# Patient Record
Sex: Female | Born: 1960 | Race: White | Hispanic: No | Smoking: Current every day smoker
Health system: Southern US, Community
[De-identification: ages and names within clinical notes are randomized; demographics above are authoritative.]

## PROBLEM LIST (undated history)

## (undated) DIAGNOSIS — F32A Depression, unspecified: Secondary | ICD-10-CM

## (undated) DIAGNOSIS — R569 Unspecified convulsions: Secondary | ICD-10-CM

## (undated) DIAGNOSIS — L409 Psoriasis, unspecified: Secondary | ICD-10-CM

## (undated) DIAGNOSIS — M0520 Rheumatoid vasculitis with rheumatoid arthritis of unspecified site: Secondary | ICD-10-CM

## (undated) DIAGNOSIS — F419 Anxiety disorder, unspecified: Secondary | ICD-10-CM

## (undated) DIAGNOSIS — J449 Chronic obstructive pulmonary disease, unspecified: Secondary | ICD-10-CM

## (undated) DIAGNOSIS — R0600 Dyspnea, unspecified: Secondary | ICD-10-CM

## (undated) HISTORY — PX: KNEE SURGERY: SHX244

## (undated) HISTORY — PX: LAPAROSCOPIC HYSTERECTOMY: SHX1926

---

## 1998-10-15 ENCOUNTER — Emergency Department (HOSPITAL_COMMUNITY): Admission: EM | Admit: 1998-10-15 | Discharge: 1998-10-15 | Payer: Self-pay | Admitting: Emergency Medicine

## 1998-10-15 ENCOUNTER — Encounter: Payer: Self-pay | Admitting: Emergency Medicine

## 2006-02-11 ENCOUNTER — Ambulatory Visit: Payer: Self-pay | Admitting: Cardiology

## 2021-06-10 DIAGNOSIS — L4059 Other psoriatic arthropathy: Secondary | ICD-10-CM | POA: Diagnosis not present

## 2021-06-10 DIAGNOSIS — L409 Psoriasis, unspecified: Secondary | ICD-10-CM | POA: Diagnosis not present

## 2021-06-10 DIAGNOSIS — R5382 Chronic fatigue, unspecified: Secondary | ICD-10-CM | POA: Diagnosis not present

## 2021-06-10 DIAGNOSIS — Z6827 Body mass index (BMI) 27.0-27.9, adult: Secondary | ICD-10-CM | POA: Diagnosis not present

## 2021-06-10 DIAGNOSIS — M79642 Pain in left hand: Secondary | ICD-10-CM | POA: Diagnosis not present

## 2021-06-10 DIAGNOSIS — M79641 Pain in right hand: Secondary | ICD-10-CM | POA: Diagnosis not present

## 2021-06-10 DIAGNOSIS — L03011 Cellulitis of right finger: Secondary | ICD-10-CM | POA: Diagnosis not present

## 2021-06-10 DIAGNOSIS — E663 Overweight: Secondary | ICD-10-CM | POA: Diagnosis not present

## 2021-07-08 DIAGNOSIS — R7989 Other specified abnormal findings of blood chemistry: Secondary | ICD-10-CM | POA: Diagnosis not present

## 2021-08-26 DIAGNOSIS — L409 Psoriasis, unspecified: Secondary | ICD-10-CM | POA: Diagnosis not present

## 2021-08-26 DIAGNOSIS — M79641 Pain in right hand: Secondary | ICD-10-CM | POA: Diagnosis not present

## 2021-08-26 DIAGNOSIS — M79642 Pain in left hand: Secondary | ICD-10-CM | POA: Diagnosis not present

## 2021-08-26 DIAGNOSIS — R5382 Chronic fatigue, unspecified: Secondary | ICD-10-CM | POA: Diagnosis not present

## 2021-08-26 DIAGNOSIS — L4059 Other psoriatic arthropathy: Secondary | ICD-10-CM | POA: Diagnosis not present

## 2021-08-26 DIAGNOSIS — L03011 Cellulitis of right finger: Secondary | ICD-10-CM | POA: Diagnosis not present

## 2021-08-26 DIAGNOSIS — Z6828 Body mass index (BMI) 28.0-28.9, adult: Secondary | ICD-10-CM | POA: Diagnosis not present

## 2021-08-26 DIAGNOSIS — E663 Overweight: Secondary | ICD-10-CM | POA: Diagnosis not present

## 2021-08-28 DIAGNOSIS — L4050 Arthropathic psoriasis, unspecified: Secondary | ICD-10-CM | POA: Diagnosis not present

## 2021-08-28 DIAGNOSIS — L403 Pustulosis palmaris et plantaris: Secondary | ICD-10-CM | POA: Diagnosis not present

## 2021-08-28 DIAGNOSIS — L601 Onycholysis: Secondary | ICD-10-CM | POA: Diagnosis not present

## 2021-09-01 DIAGNOSIS — E559 Vitamin D deficiency, unspecified: Secondary | ICD-10-CM | POA: Diagnosis not present

## 2021-09-01 DIAGNOSIS — R69 Illness, unspecified: Secondary | ICD-10-CM | POA: Diagnosis not present

## 2021-09-01 DIAGNOSIS — J449 Chronic obstructive pulmonary disease, unspecified: Secondary | ICD-10-CM | POA: Diagnosis not present

## 2021-09-01 DIAGNOSIS — Z1239 Encounter for other screening for malignant neoplasm of breast: Secondary | ICD-10-CM | POA: Diagnosis not present

## 2021-09-01 DIAGNOSIS — Z79899 Other long term (current) drug therapy: Secondary | ICD-10-CM | POA: Diagnosis not present

## 2021-09-01 DIAGNOSIS — L4050 Arthropathic psoriasis, unspecified: Secondary | ICD-10-CM | POA: Diagnosis not present

## 2021-09-01 DIAGNOSIS — F17200 Nicotine dependence, unspecified, uncomplicated: Secondary | ICD-10-CM | POA: Diagnosis not present

## 2021-09-13 DIAGNOSIS — R220 Localized swelling, mass and lump, head: Secondary | ICD-10-CM | POA: Diagnosis not present

## 2021-09-13 DIAGNOSIS — K029 Dental caries, unspecified: Secondary | ICD-10-CM | POA: Diagnosis not present

## 2021-09-13 DIAGNOSIS — R6884 Jaw pain: Secondary | ICD-10-CM | POA: Diagnosis not present

## 2021-09-13 DIAGNOSIS — K047 Periapical abscess without sinus: Secondary | ICD-10-CM | POA: Diagnosis not present

## 2021-09-18 ENCOUNTER — Inpatient Hospital Stay (HOSPITAL_COMMUNITY)
Admission: EM | Admit: 2021-09-18 | Discharge: 2021-09-23 | DRG: 137 | Disposition: A | Discharge status: Home Health | Payer: Medicare HMO | Attending: Internal Medicine | Admitting: Internal Medicine

## 2021-09-18 ENCOUNTER — Other Ambulatory Visit: Payer: Self-pay

## 2021-09-18 ENCOUNTER — Emergency Department (HOSPITAL_COMMUNITY): Payer: Medicare HMO

## 2021-09-18 ENCOUNTER — Encounter (HOSPITAL_COMMUNITY): Payer: Self-pay | Admitting: Emergency Medicine

## 2021-09-18 DIAGNOSIS — J391 Other abscess of pharynx: Secondary | ICD-10-CM | POA: Diagnosis not present

## 2021-09-18 DIAGNOSIS — F17210 Nicotine dependence, cigarettes, uncomplicated: Secondary | ICD-10-CM | POA: Diagnosis present

## 2021-09-18 DIAGNOSIS — D62 Acute posthemorrhagic anemia: Secondary | ICD-10-CM | POA: Diagnosis not present

## 2021-09-18 DIAGNOSIS — J386 Stenosis of larynx: Secondary | ICD-10-CM | POA: Diagnosis present

## 2021-09-18 DIAGNOSIS — Z20822 Contact with and (suspected) exposure to covid-19: Secondary | ICD-10-CM | POA: Diagnosis present

## 2021-09-18 DIAGNOSIS — Z79899 Other long term (current) drug therapy: Secondary | ICD-10-CM | POA: Diagnosis not present

## 2021-09-18 DIAGNOSIS — E876 Hypokalemia: Secondary | ICD-10-CM | POA: Diagnosis present

## 2021-09-18 DIAGNOSIS — K047 Periapical abscess without sinus: Principal | ICD-10-CM | POA: Diagnosis present

## 2021-09-18 DIAGNOSIS — L0201 Cutaneous abscess of face: Secondary | ICD-10-CM | POA: Diagnosis not present

## 2021-09-18 DIAGNOSIS — E871 Hypo-osmolality and hyponatremia: Secondary | ICD-10-CM | POA: Diagnosis present

## 2021-09-18 DIAGNOSIS — J9601 Acute respiratory failure with hypoxia: Secondary | ICD-10-CM | POA: Diagnosis present

## 2021-09-18 DIAGNOSIS — R739 Hyperglycemia, unspecified: Secondary | ICD-10-CM | POA: Diagnosis not present

## 2021-09-18 DIAGNOSIS — M069 Rheumatoid arthritis, unspecified: Secondary | ICD-10-CM | POA: Diagnosis present

## 2021-09-18 DIAGNOSIS — Z4682 Encounter for fitting and adjustment of non-vascular catheter: Secondary | ICD-10-CM | POA: Diagnosis not present

## 2021-09-18 DIAGNOSIS — F17290 Nicotine dependence, other tobacco product, uncomplicated: Secondary | ICD-10-CM | POA: Diagnosis present

## 2021-09-18 DIAGNOSIS — Z9911 Dependence on respirator [ventilator] status: Secondary | ICD-10-CM | POA: Diagnosis not present

## 2021-09-18 DIAGNOSIS — F419 Anxiety disorder, unspecified: Secondary | ICD-10-CM | POA: Diagnosis present

## 2021-09-18 DIAGNOSIS — J439 Emphysema, unspecified: Secondary | ICD-10-CM | POA: Diagnosis present

## 2021-09-18 DIAGNOSIS — Z90710 Acquired absence of both cervix and uterus: Secondary | ICD-10-CM | POA: Diagnosis not present

## 2021-09-18 DIAGNOSIS — R252 Cramp and spasm: Secondary | ICD-10-CM | POA: Diagnosis not present

## 2021-09-18 DIAGNOSIS — J95821 Acute postprocedural respiratory failure: Secondary | ICD-10-CM | POA: Diagnosis not present

## 2021-09-18 DIAGNOSIS — M272 Inflammatory conditions of jaws: Secondary | ICD-10-CM | POA: Diagnosis present

## 2021-09-18 DIAGNOSIS — F32A Depression, unspecified: Secondary | ICD-10-CM | POA: Diagnosis present

## 2021-09-18 DIAGNOSIS — L4050 Arthropathic psoriasis, unspecified: Secondary | ICD-10-CM | POA: Diagnosis not present

## 2021-09-18 DIAGNOSIS — M26602 Left temporomandibular joint disorder, unspecified: Secondary | ICD-10-CM | POA: Diagnosis not present

## 2021-09-18 DIAGNOSIS — J9811 Atelectasis: Secondary | ICD-10-CM | POA: Diagnosis not present

## 2021-09-18 DIAGNOSIS — J449 Chronic obstructive pulmonary disease, unspecified: Secondary | ICD-10-CM | POA: Diagnosis not present

## 2021-09-18 DIAGNOSIS — J387 Other diseases of larynx: Secondary | ICD-10-CM | POA: Diagnosis not present

## 2021-09-18 DIAGNOSIS — J988 Other specified respiratory disorders: Secondary | ICD-10-CM | POA: Diagnosis not present

## 2021-09-18 DIAGNOSIS — Z716 Tobacco abuse counseling: Secondary | ICD-10-CM

## 2021-09-18 DIAGNOSIS — F418 Other specified anxiety disorders: Secondary | ICD-10-CM | POA: Diagnosis not present

## 2021-09-18 DIAGNOSIS — R69 Illness, unspecified: Secondary | ICD-10-CM | POA: Diagnosis not present

## 2021-09-18 DIAGNOSIS — L0291 Cutaneous abscess, unspecified: Secondary | ICD-10-CM | POA: Diagnosis present

## 2021-09-18 DIAGNOSIS — A419 Sepsis, unspecified organism: Secondary | ICD-10-CM | POA: Diagnosis not present

## 2021-09-18 DIAGNOSIS — Z872 Personal history of diseases of the skin and subcutaneous tissue: Secondary | ICD-10-CM | POA: Diagnosis not present

## 2021-09-18 HISTORY — DX: Anxiety disorder, unspecified: F41.9

## 2021-09-18 HISTORY — DX: Psoriasis, unspecified: L40.9

## 2021-09-18 HISTORY — DX: Dyspnea, unspecified: R06.00

## 2021-09-18 HISTORY — DX: Rheumatoid vasculitis with rheumatoid arthritis of unspecified site: M05.20

## 2021-09-18 HISTORY — DX: Chronic obstructive pulmonary disease, unspecified: J44.9

## 2021-09-18 HISTORY — DX: Unspecified convulsions: R56.9

## 2021-09-18 HISTORY — DX: Depression, unspecified: F32.A

## 2021-09-18 LAB — CBC WITH DIFFERENTIAL/PLATELET
Abs Immature Granulocytes: 0.04 10*3/uL (ref 0.00–0.07)
Basophils Absolute: 0 10*3/uL (ref 0.0–0.1)
Basophils Relative: 0 %
Eosinophils Absolute: 0.1 10*3/uL (ref 0.0–0.5)
Eosinophils Relative: 1 %
HCT: 33.6 % — ABNORMAL LOW (ref 36.0–46.0)
Hemoglobin: 11.4 g/dL — ABNORMAL LOW (ref 12.0–15.0)
Immature Granulocytes: 1 %
Lymphocytes Relative: 20 %
Lymphs Abs: 1.4 10*3/uL (ref 0.7–4.0)
MCH: 30.2 pg (ref 26.0–34.0)
MCHC: 33.9 g/dL (ref 30.0–36.0)
MCV: 89.1 fL (ref 80.0–100.0)
Monocytes Absolute: 0.6 10*3/uL (ref 0.1–1.0)
Monocytes Relative: 8 %
Neutro Abs: 5 10*3/uL (ref 1.7–7.7)
Neutrophils Relative %: 70 %
Platelets: 229 10*3/uL (ref 150–400)
RBC: 3.77 MIL/uL — ABNORMAL LOW (ref 3.87–5.11)
RDW: 12.9 % (ref 11.5–15.5)
WBC: 7.1 10*3/uL (ref 4.0–10.5)
nRBC: 0 % (ref 0.0–0.2)

## 2021-09-18 LAB — COMPREHENSIVE METABOLIC PANEL
ALT: 15 U/L (ref 0–44)
AST: 12 U/L — ABNORMAL LOW (ref 15–41)
Albumin: 2.7 g/dL — ABNORMAL LOW (ref 3.5–5.0)
Alkaline Phosphatase: 97 U/L (ref 38–126)
Anion gap: 11 (ref 5–15)
BUN: 18 mg/dL (ref 6–20)
CO2: 30 mmol/L (ref 22–32)
Calcium: 8.3 mg/dL — ABNORMAL LOW (ref 8.9–10.3)
Chloride: 91 mmol/L — ABNORMAL LOW (ref 98–111)
Creatinine, Ser: 0.74 mg/dL (ref 0.44–1.00)
GFR, Estimated: >60 mL/min (ref >60)
Glucose, Bld: 134 mg/dL — ABNORMAL HIGH (ref 70–99)
Potassium: 2.3 mmol/L — CL (ref 3.5–5.1)
Sodium: 132 mmol/L — ABNORMAL LOW (ref 135–145)
Total Bilirubin: 0.7 mg/dL (ref 0.3–1.2)
Total Protein: 6.5 g/dL (ref 6.5–8.1)

## 2021-09-18 LAB — MAGNESIUM
Magnesium: 1.5 mg/dL — ABNORMAL LOW (ref 1.7–2.4)
Magnesium: 1.4 mg/dL — ABNORMAL LOW (ref 1.7–2.4)

## 2021-09-18 LAB — RESP PANEL BY RT-PCR (FLU A&B, COVID) ARPGX2
Influenza A by PCR: NEGATIVE
Influenza B by PCR: NEGATIVE
SARS Coronavirus 2 by RT PCR: NEGATIVE

## 2021-09-18 LAB — HIV ANTIBODY (ROUTINE TESTING W REFLEX): HIV Screen 4th Generation wRfx: NONREACTIVE

## 2021-09-18 MED ORDER — MORPHINE SULFATE (PF) 4 MG/ML IV SOLN
4.0000 mg | Freq: Once | INTRAVENOUS | Status: AC
  Administered 2021-09-18: 4 mg via INTRAVENOUS
  Filled 2021-09-18: qty 1

## 2021-09-18 MED ORDER — CHLORHEXIDINE GLUCONATE CLOTH 2 % EX PADS: 6.0000 | MEDICATED_PAD | Freq: Once | CUTANEOUS | Status: DC

## 2021-09-18 MED ORDER — ALBUTEROL SULFATE (2.5 MG/3ML) 0.083% IN NEBU: 2.5000 mg | INHALATION_SOLUTION | RESPIRATORY_TRACT | Status: DC | PRN

## 2021-09-18 MED ORDER — ALBUTEROL SULFATE HFA 108 (90 BASE) MCG/ACT IN AERS: 1.0000 | INHALATION_SPRAY | RESPIRATORY_TRACT | Status: DC | PRN

## 2021-09-18 MED ORDER — ONDANSETRON HCL 4 MG/2ML IJ SOLN: 4.0000 mg | Freq: Four times a day (QID) | INTRAMUSCULAR | Status: DC | PRN

## 2021-09-18 MED ORDER — MAGNESIUM SULFATE 2 GM/50ML IV SOLN
2.0000 g | Freq: Once | INTRAVENOUS | Status: AC
  Administered 2021-09-18: 2 g via INTRAVENOUS
  Filled 2021-09-18: qty 50

## 2021-09-18 MED ORDER — SODIUM CHLORIDE 0.9 % IV SOLN
2.0000 g | INTRAVENOUS | Status: AC
  Administered 2021-09-19: 2 g via INTRAVENOUS
  Filled 2021-09-18: qty 20

## 2021-09-18 MED ORDER — POTASSIUM CHLORIDE 10 MEQ/100ML IV SOLN
10.0000 meq | Freq: Once | INTRAVENOUS | Status: AC
  Administered 2021-09-18: 10 meq via INTRAVENOUS
  Filled 2021-09-18: qty 100

## 2021-09-18 MED ORDER — ENOXAPARIN SODIUM 40 MG/0.4ML IJ SOSY
40.0000 mg | PREFILLED_SYRINGE | INTRAMUSCULAR | Status: DC
  Administered 2021-09-18 – 2021-09-22 (×5): 40 mg via SUBCUTANEOUS
  Filled 2021-09-18 (×5): qty 0.4

## 2021-09-18 MED ORDER — HYDROMORPHONE HCL 1 MG/ML IJ SOLN
1.0000 mg | INTRAMUSCULAR | Status: DC | PRN
  Administered 2021-09-18 – 2021-09-19 (×4): 1 mg via INTRAVENOUS
  Filled 2021-09-18 (×4): qty 1

## 2021-09-18 MED ORDER — METRONIDAZOLE 500 MG/100ML IV SOLN
500.0000 mg | INTRAVENOUS | Status: AC
  Administered 2021-09-19: 500 mg via INTRAVENOUS

## 2021-09-18 MED ORDER — SODIUM CHLORIDE 0.9 % IV SOLN
2.0000 g | INTRAVENOUS | Status: DC
  Administered 2021-09-18 – 2021-09-23 (×6): 2 g via INTRAVENOUS
  Filled 2021-09-18 (×6): qty 20

## 2021-09-18 MED ORDER — NICOTINE 21 MG/24HR TD PT24
21.0000 mg | MEDICATED_PATCH | Freq: Every day | TRANSDERMAL | Status: DC
  Administered 2021-09-18 – 2021-09-23 (×5): 21 mg via TRANSDERMAL
  Filled 2021-09-18 (×5): qty 1

## 2021-09-18 MED ORDER — ONDANSETRON HCL 4 MG/2ML IJ SOLN
4.0000 mg | Freq: Once | INTRAMUSCULAR | Status: AC
  Administered 2021-09-18: 4 mg via INTRAVENOUS
  Filled 2021-09-18: qty 2

## 2021-09-18 MED ORDER — ONDANSETRON HCL 4 MG PO TABS: 4.0000 mg | ORAL_TABLET | Freq: Four times a day (QID) | ORAL | Status: DC | PRN

## 2021-09-18 MED ORDER — METRONIDAZOLE 500 MG/100ML IV SOLN
500.0000 mg | Freq: Two times a day (BID) | INTRAVENOUS | Status: DC
  Administered 2021-09-18 – 2021-09-23 (×9): 500 mg via INTRAVENOUS
  Filled 2021-09-18 (×10): qty 100

## 2021-09-18 MED ORDER — IOHEXOL 300 MG/ML  SOLN
75.0000 mL | Freq: Once | INTRAMUSCULAR | Status: AC | PRN
  Administered 2021-09-18: 75 mL via INTRAVENOUS

## 2021-09-18 MED ORDER — KCL IN DEXTROSE-NACL 20-5-0.45 MEQ/L-%-% IV SOLN
INTRAVENOUS | Status: DC
  Filled 2021-09-18 (×5): qty 1000

## 2021-09-18 MED ORDER — FENTANYL CITRATE PF 50 MCG/ML IJ SOSY
50.0000 ug | PREFILLED_SYRINGE | Freq: Once | INTRAMUSCULAR | Status: AC
  Administered 2021-09-18: 50 ug via INTRAVENOUS
  Filled 2021-09-18: qty 1

## 2021-09-18 NOTE — ED Notes (Signed)
Up to bathroom

## 2021-09-18 NOTE — Anesthesia Preprocedure Evaluation (Addendum)
Anesthesia Evaluation  ?Patient identified by MRN, date of birth, ID band ?Patient awake ? ? ? ?Reviewed: ?Allergy & Precautions, NPO status , Patient's Chart, lab work & pertinent test results ? ?History of Anesthesia Complications ?Negative for: history of anesthetic complications ? ?Airway ?Mallampati: Unable to assess ? ?TM Distance: >3 FB ?Neck ROM: Limited ? ?Mouth opening: Limited Mouth Opening ?Comment: Severe infection of submandibular space, tooth, oropharynx on L, extends into orbit  Dental ? ?(+) Dental Advisory Given ?  ?Pulmonary ?COPD,  COPD inhaler, Current Smoker and Patient abstained from smoking.,  ?  ?breath sounds clear to auscultation ? ? ? ? ? ? Cardiovascular ?negative cardio ROS ? ? ?Rhythm:Regular Rate:Normal ? ? ?  ?Neuro/Psych ?Seizures -,  Anxiety Depression   ? GI/Hepatic ?negative GI ROS, Neg liver ROS,   ?Endo/Other  ? ? Renal/GU ?negative Renal ROS  ? ?  ?Musculoskeletal ? ?(+) Arthritis , Rheumatoid disorders,   ? Abdominal ?  ?Peds ? Hematology ?negative hematology ROS ?(+)   ?Anesthesia Other Findings ?CT: Mild soft tissue thickening involving the left aspect of the epiglottis, tongue base, and lateral oropharynx ?Widespread, likely odontogenic infection involving the left ?submandibular and left masticator spaces with a large abscess and ?myositis extending from the masticator space into the scalp with ?superior extent not included on this study ? Reproductive/Obstetrics ? ?  ? ? ? ? ? ? ? ? ? ? ? ? ? ?  ?  ? ? ? ? ? ? ? ?Anesthesia Physical ?Anesthesia Plan ? ?ASA: 4 ? ?Anesthesia Plan: General  ? ?Post-op Pain Management: Ofirmev IV (intra-op)*  ? ?Induction: Intravenous ? ?PONV Risk Score and Plan: 3 and Ondansetron, Dexamethasone and Treatment may vary due to age or medical condition ? ?Airway Management Planned: Awake Intubation Planned and Fiberoptic Intubation Planned ? ?Additional Equipment: None ? ?Intra-op Plan:  ? ?Post-operative Plan:  Post-operative intubation/ventilation ? ?Informed Consent: I have reviewed the patients History and Physical, chart, labs and discussed the procedure including the risks, benefits and alternatives for the proposed anesthesia with the patient or authorized representative who has indicated his/her understanding and acceptance.  ? ? ? ?Dental advisory given ? ?Plan Discussed with: CRNA and Surgeon ? ?Anesthesia Plan Comments: (Awake intubation)  ? ? ? ? ? ?Anesthesia Quick Evaluation ? ?

## 2021-09-18 NOTE — Assessment & Plan Note (Signed)
Stable from a respiratory standpoint at present ?Continue home regimen ?Follow ?

## 2021-09-18 NOTE — Assessment & Plan Note (Signed)
K 2.3 on presentation  ?Suspect secondary to decreased po intake  ?Replete  ?Also replete Mg ?Follow  ? ?

## 2021-09-18 NOTE — Assessment & Plan Note (Signed)
Baseline 1 pack/day smoker ?Discussed smoking cessation at length ?Suspect this is likely confounding issue in the setting of dental abscess ?Patient expressed understanding ?We will place nicotine patch ?Follow ?

## 2021-09-18 NOTE — Assessment & Plan Note (Signed)
Noted progressively worsening left-sided dental abscess status post course of clindamycin and Augmentin over the past 7 days. ?CT soft tissue showing Widespread odontogenic infection involving the left submandibular and left masticator spaces with a large abscess and myositis extending from the masticator space into the scalp ?Dr. Herbie Saxon with oral surgery/ENT coverage aware of case ?Planning for surgical I&D in the morning ?Started on IV Rocephin and Flagyl per recommendations in the ER ?We will continue IV antibiotics ?Pain control ?Follow closely ? ?

## 2021-09-18 NOTE — Assessment & Plan Note (Signed)
Appears to be at baseline  ?Follow  ? ?

## 2021-09-18 NOTE — H&P (Signed)
?History and Physical  ? ? ?Patient: Carolyn Black ZOX:[id number] DOB: 1960-09-08 ?DOA: 09/18/2021 ?DOS: the patient was seen and examined on 09/18/2021 ?PCP: [facility]  ?Patient coming from: Home ? ?Chief Complaint:  ?Chief Complaint  ?Patient presents with  ? Dental Pain  ? ?HPI: Carolyn Black is a 61 y.o. female with medical history significant of COPD, tobacco abuse, psoriatic arthritis presenting with left-sided dental abscess.  Patient with noted dental pain for the past week.  Patient was initially seen in the ER for symptoms and was placed on clindamycin for antibiotic coverage.  Per report, patient had worsening pain and was reevaluated by the dentist later in the week.  Patient was then switched to Augmentin.  Per the patient, symptoms have progressively worsened despite antibiotic coverage.  Positive generalized malaise as well as decreased p.o. intake.  Minimal chest pain or shortness of breath.  This is baseline in setting of COPD per patient no belly pain.  No hemiparesis or confusion.  Positive nausea.  No reported vomiting. ?Presented to the ER afebrile, hemodynamically stable.  White count 7.1, hemoglobin 11.4.  Potassium 2.3, magnesium 1.5.  Creatinine 0.74.  CT of the soft tissue neck showing widespread odontogenic infection involving the left-sided submandibular and left masticator spaces with large abscess and myositis extend into the masticator space into the scalp. ? ?Review of Systems: As mentioned in the history of present illness. All other systems reviewed and are negative. ?Past Medical History:  ?Diagnosis Date  ? COPD (chronic obstructive pulmonary disease) (HCC)   ? Psoriasis   ? Rheumatoid arteritis (HCC)   ? Seizures (HCC)   ? ? ?Social History:  has no history on file for tobacco use, alcohol use, and drug use. ? ?No Known Allergies ? ?No family history on file. ? ?Prior to Admission medications   ?Medication Sig Start Date End Date Taking? Authorizing Provider  ?ADVAIR  DISKUS 250-50 MCG/ACT AEPB Inhale 1 puff into the lungs 2 (two) times daily. 09/01/21  Yes [provider]  ?albuterol (VENTOLIN HFA) 108 (90 Base) MCG/ACT inhaler Inhale into the lungs. 09/02/21  Yes [provider]  ?ALPRAZolam Prudy Feeler) 0.25 MG tablet Take 0.25 mg by mouth 2 (two) times daily as needed for anxiety.   Yes [provider]  ?amoxicillin-clavulanate (AUGMENTIN) 875-125 MG tablet Take 1 tablet by mouth 2 (two) times daily. 09/16/21  Yes [provider]  ?cetirizine (ZYRTEC) 10 MG tablet Take 10 mg by mouth daily. 08/28/21  Yes [provider]  ?Cholecalciferol (VITAMIN D3 PO) Take 50,000 Units by mouth See admin instructions. 1 tablet weekly on Saturdays   Yes [provider]  ?clindamycin (CLEOCIN) 300 MG capsule Take 300 mg by mouth 3 (three) times daily. 09/14/21  Yes [provider]  ?diclofenac (VOLTAREN) 75 MG EC tablet Take 75 mg by mouth 2 (two) times daily.   Yes [provider]  ?diphenhydrAMINE (BENADRYL) 25 MG tablet Take 25 mg by mouth at bedtime as needed for sleep. 08/28/21 09/27/21 Yes [provider]  ?lidocaine (XYLOCAINE) 5 % ointment Apply topically. 08/11/21  Yes [provider]  ?oxyCODONE (OXY IR/ROXICODONE) 5 MG immediate release tablet Take 5 mg by mouth See admin instructions. 2 capsules by mouth every 4 to 6 hours as needed for pain   Yes [provider]  ?traMADol (ULTRAM) 50 MG tablet Take by mouth.   Yes [provider]  ? ? ?Physical Exam: ?Vitals:  ? 09/18/21 1636 09/18/21 1645 09/18/21 1700 09/18/21  1730  ?BP:  (!) 162/66 132/60 140/68  ?Pulse:  80 67 88  ?Resp:  18 17 (!) 21  ?Temp: 98.9 ?F (37.2 ?C)     ?TempSrc: Oral     ?SpO2:  92% 90% 94%  ?Weight:      ?Height:      ? ?Physical Exam ?Constitutional:   ?   Comments: In mild distress 2/2 pain  ?  ?HENT:  ?   Head:  ? ?   Comments: + marked redness and swelling of affected area  ?+ difficulty opening oral cavity secondary  to severe pain  ?Airway appears intact  ? ?Eyes:  ?   Pupils: Pupils are equal, round, and reactive to light.  ?Neck:  ?   Comments: As noted  ?Cardiovascular:  ?   Rate and Rhythm: Normal rate and regular rhythm.  ?Pulmonary:  ?   Effort: Pulmonary effort is normal.  ?   Breath sounds: No wheezing.  ?Abdominal:  ?   General: Bowel sounds are normal.  ?Musculoskeletal:     ?   General: Normal range of motion.  ?Skin: ?   General: Skin is warm.  ?Neurological:  ?   General: No focal deficit present.  ?Psychiatric:     ?   Mood and Affect: Mood normal.  ?  ?Data Reviewed: ? ?  Latest Ref Rng & Units 09/18/2021  ?  3:05 PM  ?CBC  ?WBC 4.0 - 10.5 K/uL 7.1    ?Hemoglobin 12.0 - 15.0 g/dL 16.111.4    ?Hematocrit 36.0 - 46.0 % 33.6    ?Platelets 150 - 400 K/uL 229    ?  ?Lab Results  ?Component Value Date  ? NA 132 (L) 09/18/2021  ? K 2.3 (LL) 09/18/2021  ? CO2 30 09/18/2021  ? GLUCOSE 134 (H) 09/18/2021  ? BUN 18 09/18/2021  ? CREATININE 0.74 09/18/2021  ? CALCIUM 8.3 (L) 09/18/2021  ? GFRNONAA >60 09/18/2021  ?  ?CT Soft Tissue Neck W Contrast ?Addendum: ADDENDUM REPORT: 09/18/2021 17:26 ? ? ADDENDUM: ? Mild soft tissue thickening involving the left aspect of the ? epiglottis, tongue base, and lateral oropharynx. This may be ? inflammatory, however direct visualization is recommended to exclude ? a mass. ? ? Electronically Signed ?   By: Sebastian AcheAllen  Grady M.D. ?   On: 09/18/2021 17:26 ?Narrative: CLINICAL DATA:  Left-sided facial soft tissue swelling. Infection ?suspected. ? ?EXAM: ?CT NECK WITH CONTRAST ? ?TECHNIQUE: ?Multidetector CT imaging of the neck was performed using the ?standard protocol following the bolus administration of intravenous ?contrast. ? ?RADIATION DOSE REDUCTION: This exam was performed according to the ?departmental dose-optimization program which includes automated ?exposure control, adjustment of the mA and/or kV according to ?patient size and/or use of iterative reconstruction technique. ? ?CONTRAST:   75mL OMNIPAQUE IOHEXOL 300 MG/ML  SOLN ? ?COMPARISON:  None Available. ? ?FINDINGS: ?Pharynx and larynx: Asymmetric, mild thickening involving the left ?aspect of the epiglottis, left tongue base, and left lateral ?oropharyngeal wall. No retropharyngeal fluid collection. ? ?Salivary glands: Marked asymmetric atrophy of the right ?submandibular gland. Mass effect on and possibly secondary ?inflammation of the left parotid gland by the process described ?below. ? ?Thyroid: Unremarkable. ? ?Lymph nodes: Mild asymmetric enlargement of left level I and II ?lymph nodes measuring up to 1 cm in short axis, likely reactive. ? ?Vascular: Major vascular structures of the neck are grossly patent. ?Mild atherosclerotic plaque at the carotid bifurcations. ? ?Limited intracranial: Unremarkable. ? ?Visualized orbits: Unremarkable. ? ?  Mastoids and visualized paranasal sinuses: Clear. ? ?Skeleton: Largely absent dentition. Caries and periapical lucencies ?involving the remaining mandibular incisors. 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 Mild cervical spondylosis. ? ?Upper chest: Emphysema. ? ?Other: There is inflammation within the soft tissues surrounding the ?left mandible beginning in the parasymphyseal region and extending ?posteriorly along the body and ramus. A small fluid collection along ?the deep margin of the anterior left mandibular body measures up to ?4 mm in short axis thickness. There is a larger, peripherally ?enhancing fluid collection which extends from the deep margin of the ?angle of the mandible on the left superiorly along the ramus with ?involvement of the masseter and pterygoid musculature and extension ?superiorly into the scalp on the left along the temporalis muscle ?with the superior extent of the collection not included on this ?study. This collection measures greater than 9 cm in craniocaudal ?dimension and measures approximately 4.5 cm in AP dimension at the ?level of the infratemporal fossa and  at least 6.5 cm in AP dimension ?in the left frontotemporal scalp. ? ?IMPRESSION: ?1. Widespread, likely odontogenic infection involving the left ?submandibular and left masticator spaces with a large

## 2021-09-18 NOTE — H&P (Signed)
H&P Infection ? ?Exam Date: 09/19/21 ? ?ID: The patient is a 25 yoF who presented with pain and swelling of the left face.. ? ?History of Present Illness:  The patient reports having pain and swelling that began over a week ago. She has been seen by several dental providers and has failed courses of clindamycin and augmentin and her swelling has continued to progress.  The patient was referred by OSH Forestine Na) for evaluation.  The patient reports trismus and fevers. Denies dyspnea or dysphagia.  ?  ?Clinical Exam: ?Extraoral Exam:  ?            Patient is alert, orientated and in distress ?            CN II-XII grossly intact ?            There is appreciable facial swelling on the left side of the face.  ?            The swelling extends into the neck/periorbital and temporal region  ?  ?Intraoral Exam:  ?            The patient does have trismus with maximum incisal opening of 20 mm. ?            The buccal vestibule is raised. ?            The floor of mouth is elevated on the left ?            The tongue is not elevated. ?            There is lateral pharyngeal swelling with the uvula deviated slightly to the right ?            Oral airway is patent  ?            Non-restorable remaining dentition #24-26 ?  ?            Cardiovascular:  Regular rate and rhythm without any appreciable murmurs, gallops or rubs.   ?            Respiratory: Lungs were clear to auscultation bilaterally without any wheezing or rhonchi. ?            Abdomen: Non-distended  ?  ?Radiographic Exam:   ?             ?            A CT of the larynx with contrast was obtained showing fluid collections/cellulitis associated with the left submandibular, sublingual, masticator (pterygomandibular, masseteric, and temporal) space. There is minimal airway deviation.  ?  ?Assessment: 56 yoF patient with a left submandibular, sublingual, and masticator space infection.  ?  ?Plan:  The patient will require incision and drainage of the infection with  extraction of teeth as necessary in the OR.  Consent will be obtained. ?  ?Risks, complications and alternatives of tooth extraction and incision and drainage procedures were discussed and questions were answered.  Among all potential risks and complications, I emphasized the potential for pain, bleeding, swelling, infection, localized alveolar osteitis (dry socket), temporary and permanent lingual and inferior alveolar nerve injury, oroantral (sinus) communication, oronasal communication, jaw fracture, damage to adjacent teeth and tissue, joint discomfort, bone/tooth fragments, recurrence of infection, need for additional procedures, facial nerve injury, scarring, limited mouth opening, drain placement, aspiration and anesthetic mishap. ?  ?Terese Door, DDS ?Oral and Maxillofacial Surgeon ?[facility] ([place]) ?Office # [phone] ?Cell #  [phone] ? ?

## 2021-09-18 NOTE — ED Notes (Signed)
Pt verbalized she is taking 1 of her prescribed pain pill that is due now.  ?

## 2021-09-18 NOTE — ED Notes (Signed)
Pt states that she has been taking her antibiotics as prescribed and pain meds. She is due for pain meds now and is requesting meds. States that all s/s have gotten worse ?

## 2021-09-18 NOTE — ED Provider Notes (Signed)
?[facility] [facility]PART[facility] ?Provider Note ? ? ?CSN: [id number] ?Arrival date & time: 09/18/21  1128 ? ?  ? ?History ? ?Chief Complaint  ?Patient presents with  ? Dental Pain  ? ? ?Carolyn Black is a 61 y.o. female with a history including rheumatoid arthritis, COPD, psoriasis presenting for evaluation of a 1 week history of dental pain, infection and swelling.  She was initially seen at an out side hospital 1 week ago at which time she was placed on clindamycin.  She continued to have in creasing pain and facial swelling, saw her dentist on Tuesday who switched her to Augmentin.  However she continues to have increasing pain and swelling, describing inability to eat or open her mouth although has been able to tolerate fluid intake.  She is able to swallow.  She has had no documented fevers but she does have intermittent chills and hot flashes.  Her pain and swelling has now tracked up to and including her left temple region.  She reports a bad taste in her mouth, endorses poor dentition.  She feels there may be an abscess that is draining. ?The history is provided by the patient.  ? ?  ? ?Home Medications ?Prior to Admission medications   ?Medication Sig Start Date End Date Taking? Authorizing Provider  ?ADVAIR DISKUS 250-50 MCG/ACT AEPB Inhale 1 puff into the lungs 2 (two) times daily. 09/01/21  Yes [provider]  ?albuterol (VENTOLIN HFA) 108 (90 Base) MCG/ACT inhaler Inhale into the lungs. 09/02/21  Yes [provider]  ?ALPRAZolam Prudy Feeler) 0.25 MG tablet Take 0.25 mg by mouth 2 (two) times daily as needed for anxiety.   Yes [provider]  ?amoxicillin-clavulanate (AUGMENTIN) 875-125 MG tablet Take 1 tablet by mouth 2 (two) times daily. 09/16/21  Yes [provider]  ?cetirizine (ZYRTEC) 10 MG tablet Take 10 mg by mouth daily. 08/28/21  Yes [provider]  ?Cholecalciferol (VITAMIN D3 PO) Take 50,000 Units by mouth See admin instructions. 1 tablet weekly on  Saturdays   Yes [provider]  ?clindamycin (CLEOCIN) 300 MG capsule Take 300 mg by mouth 3 (three) times daily. 09/14/21  Yes [provider]  ?diclofenac (VOLTAREN) 75 MG EC tablet Take 75 mg by mouth 2 (two) times daily.   Yes [provider]  ?diphenhydrAMINE (BENADRYL) 25 MG tablet Take 25 mg by mouth at bedtime as needed for sleep. 08/28/21 09/27/21 Yes [provider]  ?lidocaine (XYLOCAINE) 5 % ointment Apply topically. 08/11/21  Yes [provider]  ?oxyCODONE (OXY IR/ROXICODONE) 5 MG immediate release tablet Take 5 mg by mouth See admin instructions. 2 capsules by mouth every 4 to 6 hours as needed for pain   Yes [provider]  ?traMADol (ULTRAM) 50 MG tablet Take by mouth.   Yes [provider]  ?   ? ?Allergies    ?Patient has no known allergies.   ? ?Review of Systems   ?Review of Systems  ?Constitutional:  Positive for chills. Negative for fever.  ?HENT:  Positive for dental problem and facial swelling. Negative for sore throat and voice change.   ?Respiratory:  Negative for shortness of breath.   ?Musculoskeletal:  Negative for neck pain and neck stiffness.  ? ?Physical Exam ?Updated Vital Signs ?BP 140/68   Pulse 88   Temp 98.9 ?F (37.2 ?C) (Oral)   Resp (!) 21   Ht  (1.6 m)   Wt 71.7 kg   SpO2 94%   BMI  27.99 kg/m?  ?Physical Exam ?Vitals and nursing note reviewed.  ?Constitutional:   ?   Appearance: She is well-developed.  ?HENT:  ?   Head: Atraumatic.  ?   Jaw: Trismus, tenderness and pain on movement present.  ?   Comments: Patient has significant tender edema along the entirety of her left face from her submandibular region through her temple region.  There is no erythema present. ?   Right Ear: Tympanic membrane normal.  ?   Left Ear: Tympanic membrane normal.  ?   Mouth/Throat:  ?   Comments: Patient is mostly edentulous.  She is tender to palpation in her sublingual space, there is no appreciable induration.  Patient is  unable to tolerate movement of her jaw. ?Eyes:  ?   Conjunctiva/sclera: Conjunctivae normal.  ?Cardiovascular:  ?   Rate and Rhythm: Normal rate and regular rhythm.  ?   Heart sounds: Normal heart sounds.  ?Pulmonary:  ?   Effort: Pulmonary effort is normal.  ?   Breath sounds: Normal breath sounds. No wheezing.  ?Abdominal:  ?   General: Bowel sounds are normal.  ?   Palpations: Abdomen is soft.  ?   Tenderness: There is no abdominal tenderness.  ?Musculoskeletal:     ?   General: Normal range of motion.  ?   Cervical back: Normal range of motion.  ?Skin: ?   General: Skin is warm and dry.  ?Neurological:  ?   Mental Status: She is alert.  ? ? ?ED Results / Procedures / Treatments   ?Labs ?(all labs ordered are listed, but only abnormal results are displayed) ?Labs Reviewed  ?CBC WITH DIFFERENTIAL/PLATELET - Abnormal; Notable for the following components:  ?    Result Value  ? RBC 3.77 (*)   ? Hemoglobin 11.4 (*)   ? HCT 33.6 (*)   ? All other components within normal limits  ?COMPREHENSIVE METABOLIC PANEL - Abnormal; Notable for the following components:  ? Sodium 132 (*)   ? Potassium 2.3 (*)   ? Chloride 91 (*)   ? Glucose, Bld 134 (*)   ? Calcium 8.3 (*)   ? Albumin 2.7 (*)   ? AST 12 (*)   ? All other components within normal limits  ?MAGNESIUM - Abnormal; Notable for the following components:  ? Magnesium 1.5 (*)   ? All other components within normal limits  ?RESP PANEL BY RT-PCR (FLU A&B, COVID) ARPGX2  ? ? ?EKG ?None ? ?Radiology ?CT Soft Tissue Neck W Contrast ? ?Addendum Date: 09/18/2021   ?ADDENDUM REPORT: 09/18/2021 17:26 ADDENDUM: Mild soft tissue thickening involving the left aspect of the epiglottis, tongue base, and lateral oropharynx. This may be inflammatory, however direct visualization is recommended to exclude a mass. Electronically Signed   By: Sebastian Ache M.D.   On: 09/18/2021 17:26  ? ?Result Date: 09/18/2021 ?CLINICAL DATA:  Left-sided facial soft tissue swelling. Infection suspected. EXAM:  CT NECK WITH CONTRAST TECHNIQUE: Multidetector CT imaging of the neck was performed using the standard protocol following the bolus administration of intravenous contrast. RADIATION DOSE REDUCTION: This exam was performed according to the departmental dose-optimization program which includes automated exposure control, adjustment of the mA and/or kV according to patient size and/or use of iterative reconstruction technique. CONTRAST:  75mL OMNIPAQUE IOHEXOL 300 MG/ML  SOLN COMPARISON:  None Available. FINDINGS: Pharynx and larynx: Asymmetric, mild thickening involving the left aspect of the epiglottis, left tongue base, and left lateral oropharyngeal wall. No retropharyngeal fluid  collection. Salivary glands: Marked asymmetric atrophy of the right submandibular gland. Mass effect on and possibly secondary inflammation of the left parotid gland by the process described below. Thyroid: Unremarkable. Lymph nodes: Mild asymmetric enlargement of left level I and II lymph nodes measuring up to 1 cm in short axis, likely reactive. Vascular: Major vascular structures of the neck are grossly patent. Mild atherosclerotic plaque at the carotid bifurcations. Limited intracranial: Unremarkable. Visualized orbits: Unremarkable. Mastoids and visualized paranasal sinuses: Clear. Skeleton: Largely absent dentition. Caries and periapical lucencies involving the remaining mandibular incisors. Asymmetric left temporomandibular arthritis. Mild cervical spondylosis. Upper chest: Emphysema. Other: There is inflammation within the soft tissues surrounding the left mandible beginning in the parasymphyseal region and extending posteriorly along the body and ramus. A small fluid collection along the deep margin of the anterior left mandibular body measures up to 4 mm in short axis thickness. There is a larger, peripherally enhancing fluid collection which extends from the deep margin of the angle of the mandible on the left superiorly along  the ramus with involvement of the masseter and pterygoid musculature and extension superiorly into the scalp on the left along the temporalis muscle with the superior extent of the collection not included on this s

## 2021-09-18 NOTE — ED Triage Notes (Signed)
Pt c/o an infection in center/lower tooth. States she was seen in the ED and was prescribed pain medications and antibiotics. Pt states she was seen by a dentist on Tuesday and was given a stronger antibiotic. Pt reports worsening symptoms including facial swelling, chills, and neck feeling "hot." Denies difficulty breathing, but endorses painful swalling. ?

## 2021-09-18 NOTE — ED Notes (Signed)
Pt requesting pain medications for 9/10 pain, Idol, PA messaged re: request ?

## 2021-09-19 ENCOUNTER — Inpatient Hospital Stay (HOSPITAL_COMMUNITY): Payer: Medicare HMO

## 2021-09-19 ENCOUNTER — Encounter (HOSPITAL_COMMUNITY): Payer: Self-pay | Admitting: Family Medicine

## 2021-09-19 ENCOUNTER — Observation Stay (HOSPITAL_COMMUNITY): Payer: Medicare HMO | Admitting: Anesthesiology

## 2021-09-19 ENCOUNTER — Encounter (HOSPITAL_COMMUNITY)
Admission: EM | Disposition: A | Discharge status: Home Health | Payer: Self-pay | Source: Home / Self Care | Attending: Pulmonary Disease

## 2021-09-19 ENCOUNTER — Other Ambulatory Visit: Payer: Self-pay

## 2021-09-19 DIAGNOSIS — M272 Inflammatory conditions of jaws: Secondary | ICD-10-CM | POA: Diagnosis not present

## 2021-09-19 DIAGNOSIS — J449 Chronic obstructive pulmonary disease, unspecified: Secondary | ICD-10-CM

## 2021-09-19 DIAGNOSIS — M069 Rheumatoid arthritis, unspecified: Secondary | ICD-10-CM

## 2021-09-19 DIAGNOSIS — J9601 Acute respiratory failure with hypoxia: Secondary | ICD-10-CM | POA: Diagnosis not present

## 2021-09-19 DIAGNOSIS — K047 Periapical abscess without sinus: Secondary | ICD-10-CM

## 2021-09-19 DIAGNOSIS — L4050 Arthropathic psoriasis, unspecified: Secondary | ICD-10-CM | POA: Diagnosis not present

## 2021-09-19 DIAGNOSIS — L0291 Cutaneous abscess, unspecified: Secondary | ICD-10-CM | POA: Diagnosis present

## 2021-09-19 DIAGNOSIS — E876 Hypokalemia: Secondary | ICD-10-CM | POA: Diagnosis not present

## 2021-09-19 DIAGNOSIS — A419 Sepsis, unspecified organism: Secondary | ICD-10-CM

## 2021-09-19 DIAGNOSIS — J386 Stenosis of larynx: Secondary | ICD-10-CM | POA: Diagnosis present

## 2021-09-19 DIAGNOSIS — F418 Other specified anxiety disorders: Secondary | ICD-10-CM

## 2021-09-19 HISTORY — PX: INCISION AND DRAINAGE ABSCESS: SHX5864

## 2021-09-19 LAB — COMPREHENSIVE METABOLIC PANEL
ALT: 13 U/L (ref 0–44)
ALT: 10 U/L (ref 0–44)
ALT: 15 U/L (ref 0–44)
AST: 11 U/L — ABNORMAL LOW (ref 15–41)
AST: 14 U/L — ABNORMAL LOW (ref 15–41)
AST: 17 U/L (ref 15–41)
Albumin: 2.4 g/dL — ABNORMAL LOW (ref 3.5–5.0)
Albumin: 2.3 g/dL — ABNORMAL LOW (ref 3.5–5.0)
Albumin: 2.5 g/dL — ABNORMAL LOW (ref 3.5–5.0)
Alkaline Phosphatase: 88 U/L (ref 38–126)
Alkaline Phosphatase: 77 U/L (ref 38–126)
Alkaline Phosphatase: 84 U/L (ref 38–126)
Anion gap: 8 (ref 5–15)
Anion gap: 10 (ref 5–15)
Anion gap: 12 (ref 5–15)
BUN: 10 mg/dL (ref 6–20)
BUN: 8 mg/dL (ref 6–20)
BUN: 8 mg/dL (ref 6–20)
CO2: 30 mmol/L (ref 22–32)
CO2: 28 mmol/L (ref 22–32)
CO2: 28 mmol/L (ref 22–32)
Calcium: 8.2 mg/dL — ABNORMAL LOW (ref 8.9–10.3)
Calcium: 8.3 mg/dL — ABNORMAL LOW (ref 8.9–10.3)
Calcium: 8.7 mg/dL — ABNORMAL LOW (ref 8.9–10.3)
Chloride: 94 mmol/L — ABNORMAL LOW (ref 98–111)
Chloride: 94 mmol/L — ABNORMAL LOW (ref 98–111)
Chloride: 95 mmol/L — ABNORMAL LOW (ref 98–111)
Creatinine, Ser: 0.66 mg/dL (ref 0.44–1.00)
Creatinine, Ser: 0.7 mg/dL (ref 0.44–1.00)
Creatinine, Ser: 0.81 mg/dL (ref 0.44–1.00)
GFR, Estimated: >60 mL/min (ref >60)
GFR, Estimated: >60 mL/min (ref >60)
GFR, Estimated: >60 mL/min (ref >60)
Glucose, Bld: 129 mg/dL — ABNORMAL HIGH (ref 70–99)
Glucose, Bld: 153 mg/dL — ABNORMAL HIGH (ref 70–99)
Glucose, Bld: 183 mg/dL — ABNORMAL HIGH (ref 70–99)
Potassium: 2.6 mmol/L — CL (ref 3.5–5.1)
Potassium: 3 mmol/L — ABNORMAL LOW (ref 3.5–5.1)
Potassium: 3.6 mmol/L (ref 3.5–5.1)
Sodium: 132 mmol/L — ABNORMAL LOW (ref 135–145)
Sodium: 132 mmol/L — ABNORMAL LOW (ref 135–145)
Sodium: 135 mmol/L (ref 135–145)
Total Bilirubin: 0.4 mg/dL (ref 0.3–1.2)
Total Bilirubin: 0.5 mg/dL (ref 0.3–1.2)
Total Bilirubin: 0.2 mg/dL — ABNORMAL LOW (ref 0.3–1.2)
Total Protein: 6.3 g/dL — ABNORMAL LOW (ref 6.5–8.1)
Total Protein: 5.8 g/dL — ABNORMAL LOW (ref 6.5–8.1)
Total Protein: 6.5 g/dL (ref 6.5–8.1)

## 2021-09-19 LAB — POCT I-STAT 7, (LYTES, BLD GAS, ICA,H+H)
Acid-Base Excess: 7 mmol/L — ABNORMAL HIGH (ref 0.0–2.0)
Bicarbonate: 31.9 mmol/L — ABNORMAL HIGH (ref 20.0–28.0)
Calcium, Ion: 1.12 mmol/L — ABNORMAL LOW (ref 1.15–1.40)
HCT: 29 % — ABNORMAL LOW (ref 36.0–46.0)
Hemoglobin: 9.9 g/dL — ABNORMAL LOW (ref 12.0–15.0)
O2 Saturation: 94 %
Patient temperature: 98.7
Potassium: 2.9 mmol/L — ABNORMAL LOW (ref 3.5–5.1)
Sodium: 133 mmol/L — ABNORMAL LOW (ref 135–145)
TCO2: 33 mmol/L — ABNORMAL HIGH (ref 22–32)
pCO2 arterial: 47 mmHg (ref 32–48)
pH, Arterial: 7.44 (ref 7.35–7.45)
pO2, Arterial: 72 mmHg — ABNORMAL LOW (ref 83–108)

## 2021-09-19 LAB — CBC WITH DIFFERENTIAL/PLATELET
Abs Immature Granulocytes: 0.01 10*3/uL (ref 0.00–0.07)
Basophils Absolute: 0.1 10*3/uL (ref 0.0–0.1)
Basophils Relative: 1 %
Eosinophils Absolute: 0.2 10*3/uL (ref 0.0–0.5)
Eosinophils Relative: 3 %
HCT: 42 % (ref 36.0–46.0)
Hemoglobin: 14.5 g/dL (ref 12.0–15.0)
Immature Granulocytes: 0 %
Lymphocytes Relative: 48 %
Lymphs Abs: 3.4 10*3/uL (ref 0.7–4.0)
MCH: 29.4 pg (ref 26.0–34.0)
MCHC: 34.5 g/dL (ref 30.0–36.0)
MCV: 85 fL (ref 80.0–100.0)
Monocytes Absolute: 0.5 10*3/uL (ref 0.1–1.0)
Monocytes Relative: 7 %
Neutro Abs: 2.9 10*3/uL (ref 1.7–7.7)
Neutrophils Relative %: 41 %
Platelets: 250 10*3/uL (ref 150–400)
RBC: 4.94 MIL/uL (ref 3.87–5.11)
RDW: 11.9 % (ref 11.5–15.5)
WBC: 7.2 10*3/uL (ref 4.0–10.5)
nRBC: 0 % (ref 0.0–0.2)

## 2021-09-19 LAB — POCT I-STAT, CHEM 8
BUN: 9 mg/dL (ref 6–20)
Calcium, Ion: 1.16 mmol/L (ref 1.15–1.40)
Chloride: 89 mmol/L — ABNORMAL LOW (ref 98–111)
Creatinine, Ser: 0.7 mg/dL (ref 0.44–1.00)
Glucose, Bld: 124 mg/dL — ABNORMAL HIGH (ref 70–99)
HCT: 34 % — ABNORMAL LOW (ref 36.0–46.0)
Hemoglobin: 11.6 g/dL — ABNORMAL LOW (ref 12.0–15.0)
Potassium: 2.9 mmol/L — ABNORMAL LOW (ref 3.5–5.1)
Sodium: 132 mmol/L — ABNORMAL LOW (ref 135–145)
TCO2: 31 mmol/L (ref 22–32)

## 2021-09-19 LAB — URINALYSIS, ROUTINE W REFLEX MICROSCOPIC
Bilirubin Urine: NEGATIVE
Glucose, UA: NEGATIVE mg/dL
Hgb urine dipstick: NEGATIVE
Ketones, ur: NEGATIVE mg/dL
Leukocytes,Ua: NEGATIVE
Nitrite: NEGATIVE
Protein, ur: NEGATIVE mg/dL
Specific Gravity, Urine: 1.003 — ABNORMAL LOW (ref 1.005–1.030)
pH: 7 (ref 5.0–8.0)

## 2021-09-19 LAB — GLUCOSE, CAPILLARY
Glucose-Capillary: 173 mg/dL — ABNORMAL HIGH (ref 70–99)
Glucose-Capillary: 194 mg/dL — ABNORMAL HIGH (ref 70–99)
Glucose-Capillary: 180 mg/dL — ABNORMAL HIGH (ref 70–99)

## 2021-09-19 LAB — MAGNESIUM: Magnesium: 1.5 mg/dL — ABNORMAL LOW (ref 1.7–2.4)

## 2021-09-19 LAB — MRSA NEXT GEN BY PCR, NASAL: MRSA by PCR Next Gen: NOT DETECTED

## 2021-09-19 SURGERY — INCISION AND DRAINAGE, ABSCESS
Anesthesia: General | Site: Face

## 2021-09-19 MED ORDER — PHENYLEPHRINE 80 MCG/ML (10ML) SYRINGE FOR IV PUSH (FOR BLOOD PRESSURE SUPPORT)
PREFILLED_SYRINGE | INTRAVENOUS | Status: AC
  Filled 2021-09-19: qty 10

## 2021-09-19 MED ORDER — CHLORHEXIDINE GLUCONATE 0.12 % MT SOLN: 15.0000 mL | Freq: Once | OROMUCOSAL | Status: DC

## 2021-09-19 MED ORDER — LIDOCAINE-EPINEPHRINE 2 %-1:100000 IJ SOLN
INTRAMUSCULAR | Status: DC | PRN
Start: 2021-09-19 — End: 2021-09-19
  Administered 2021-09-19: 2 mL

## 2021-09-19 MED ORDER — IPRATROPIUM-ALBUTEROL 0.5-2.5 (3) MG/3ML IN SOLN
3.0000 mL | RESPIRATORY_TRACT | Status: DC
  Administered 2021-09-19 – 2021-09-20 (×9): 3 mL via RESPIRATORY_TRACT
  Filled 2021-09-19 (×9): qty 3

## 2021-09-19 MED ORDER — ALBUMIN HUMAN 5 % IV SOLN: INTRAVENOUS | Status: DC | PRN

## 2021-09-19 MED ORDER — CHLORHEXIDINE GLUCONATE 0.12% ORAL RINSE (MEDLINE KIT)
15.0000 mL | Freq: Two times a day (BID) | OROMUCOSAL | Status: DC
  Administered 2021-09-19 – 2021-09-23 (×6): 15 mL via OROMUCOSAL

## 2021-09-19 MED ORDER — PANTOPRAZOLE SODIUM 40 MG IV SOLR
40.0000 mg | INTRAVENOUS | Status: DC
  Administered 2021-09-19 – 2021-09-20 (×2): 40 mg via INTRAVENOUS
  Filled 2021-09-19 (×2): qty 10

## 2021-09-19 MED ORDER — LACTATED RINGERS IV SOLN: INTRAVENOUS | Status: DC

## 2021-09-19 MED ORDER — OXYMETAZOLINE HCL 0.05 % NA SOLN
NASAL | Status: DC | PRN
  Administered 2021-09-19 (×2): 2 via NASAL

## 2021-09-19 MED ORDER — FENTANYL CITRATE (PF) 250 MCG/5ML IJ SOLN
INTRAMUSCULAR | Status: DC | PRN
  Administered 2021-09-19: 75 ug via INTRAVENOUS
  Administered 2021-09-19: 50 ug via INTRAVENOUS
  Administered 2021-09-19: 25 ug via INTRAVENOUS
  Administered 2021-09-19 (×2): 50 ug via INTRAVENOUS

## 2021-09-19 MED ORDER — BUDESONIDE 0.5 MG/2ML IN SUSP
0.5000 mg | Freq: Two times a day (BID) | RESPIRATORY_TRACT | Status: DC
  Administered 2021-09-19 – 2021-09-23 (×9): 0.5 mg via RESPIRATORY_TRACT
  Filled 2021-09-19 (×9): qty 2

## 2021-09-19 MED ORDER — OXYMETAZOLINE HCL 0.05 % NA SOLN
NASAL | Status: AC
  Filled 2021-09-19: qty 30

## 2021-09-19 MED ORDER — PHENYLEPHRINE HCL-NACL 20-0.9 MG/250ML-% IV SOLN
INTRAVENOUS | Status: DC | PRN
  Administered 2021-09-19: 30 ug/min via INTRAVENOUS

## 2021-09-19 MED ORDER — LIDOCAINE-EPINEPHRINE 2 %-1:100000 IJ SOLN
INTRAMUSCULAR | Status: AC
  Filled 2021-09-19: qty 1

## 2021-09-19 MED ORDER — DEXAMETHASONE SODIUM PHOSPHATE 10 MG/ML IJ SOLN
INTRAMUSCULAR | Status: AC
  Filled 2021-09-19: qty 1

## 2021-09-19 MED ORDER — PHENYLEPHRINE 80 MCG/ML (10ML) SYRINGE FOR IV PUSH (FOR BLOOD PRESSURE SUPPORT)
PREFILLED_SYRINGE | INTRAVENOUS | Status: DC | PRN
  Administered 2021-09-19: 160 ug via INTRAVENOUS
  Administered 2021-09-19: 80 ug via INTRAVENOUS
  Administered 2021-09-19: 160 ug via INTRAVENOUS

## 2021-09-19 MED ORDER — MAGNESIUM SULFATE 4 GM/100ML IV SOLN
4.0000 g | Freq: Once | INTRAVENOUS | Status: AC
Start: 2021-09-19 — End: 2021-09-19
  Administered 2021-09-19: 4 g via INTRAVENOUS
  Filled 2021-09-19: qty 100

## 2021-09-19 MED ORDER — KETAMINE HCL 10 MG/ML IJ SOLN
INTRAMUSCULAR | Status: DC | PRN
  Administered 2021-09-19: 20 mg via INTRAVENOUS

## 2021-09-19 MED ORDER — DEXMEDETOMIDINE (PRECEDEX) IN NS 20 MCG/5ML (4 MCG/ML) IV SYRINGE
PREFILLED_SYRINGE | INTRAVENOUS | Status: DC | PRN
Start: 2021-09-19 — End: 2021-09-19
  Administered 2021-09-19 (×2): 8 ug via INTRAVENOUS
  Administered 2021-09-19: 4 ug via INTRAVENOUS

## 2021-09-19 MED ORDER — KETAMINE HCL 50 MG/5ML IJ SOSY
PREFILLED_SYRINGE | INTRAMUSCULAR | Status: AC
  Filled 2021-09-19: qty 5

## 2021-09-19 MED ORDER — PANTOPRAZOLE 2 MG/ML SUSPENSION
40.0000 mg | Freq: Every day | ORAL | Status: DC
  Filled 2021-09-19: qty 20

## 2021-09-19 MED ORDER — ORAL CARE MOUTH RINSE
15.0000 mL | OROMUCOSAL | Status: DC
  Administered 2021-09-19 – 2021-09-20 (×12): 15 mL via OROMUCOSAL

## 2021-09-19 MED ORDER — ROCURONIUM BROMIDE 10 MG/ML (PF) SYRINGE
PREFILLED_SYRINGE | INTRAVENOUS | Status: AC
  Filled 2021-09-19: qty 10

## 2021-09-19 MED ORDER — LACTATED RINGERS IV SOLN: INTRAVENOUS | Status: DC | PRN

## 2021-09-19 MED ORDER — FENTANYL CITRATE (PF) 250 MCG/5ML IJ SOLN
INTRAMUSCULAR | Status: AC
  Filled 2021-09-19: qty 5

## 2021-09-19 MED ORDER — MIDAZOLAM HCL 2 MG/2ML IJ SOLN
INTRAMUSCULAR | Status: AC
  Filled 2021-09-19: qty 2

## 2021-09-19 MED ORDER — PROPOFOL 1000 MG/100ML IV EMUL
0.0000 ug/kg/min | INTRAVENOUS | Status: DC
  Administered 2021-09-19: 40 ug/kg/min via INTRAVENOUS
  Administered 2021-09-19: 50 ug/kg/min via INTRAVENOUS
  Administered 2021-09-19: 30 ug/kg/min via INTRAVENOUS
  Administered 2021-09-20: 45 ug/kg/min via INTRAVENOUS
  Administered 2021-09-20: 40 ug/kg/min via INTRAVENOUS
  Filled 2021-09-19 (×4): qty 100

## 2021-09-19 MED ORDER — ONDANSETRON HCL 4 MG/2ML IJ SOLN
INTRAMUSCULAR | Status: AC
  Filled 2021-09-19: qty 2

## 2021-09-19 MED ORDER — FENTANYL 2500MCG IN NS 250ML (10MCG/ML) PREMIX INFUSION
50.0000 ug/h | INTRAVENOUS | Status: DC
  Administered 2021-09-19: 50 ug/h via INTRAVENOUS
  Administered 2021-09-20: 150 ug/h via INTRAVENOUS
  Filled 2021-09-19 (×2): qty 250

## 2021-09-19 MED ORDER — NOREPINEPHRINE 4 MG/250ML-% IV SOLN
0.0000 ug/min | INTRAVENOUS | Status: DC
  Administered 2021-09-19: 4 ug/min via INTRAVENOUS
  Filled 2021-09-19: qty 250

## 2021-09-19 MED ORDER — LACTATED RINGERS IV SOLN
INTRAVENOUS | Status: DC | PRN
Start: 2021-09-19 — End: 2021-09-19

## 2021-09-19 MED ORDER — ORAL CARE MOUTH RINSE: 15.0000 mL | Freq: Once | OROMUCOSAL | Status: DC

## 2021-09-19 MED ORDER — PHENYLEPHRINE HCL-NACL 20-0.9 MG/250ML-% IV SOLN
25.0000 ug/min | INTRAVENOUS | Status: DC
  Administered 2021-09-19: 30 ug/min via INTRAVENOUS

## 2021-09-19 MED ORDER — LIDOCAINE HCL (PF) 4 % IJ SOLN
INTRAMUSCULAR | Status: DC | PRN
  Administered 2021-09-19: 4 mL via RESPIRATORY_TRACT

## 2021-09-19 MED ORDER — SODIUM CHLORIDE 0.9 % IV SOLN
250.0000 mL | INTRAVENOUS | Status: DC
  Administered 2021-09-19: 250 mL via INTRAVENOUS

## 2021-09-19 MED ORDER — IOHEXOL 350 MG/ML SOLN
100.0000 mL | Freq: Once | INTRAVENOUS | Status: AC | PRN
  Administered 2021-09-19: 100 mL via INTRAVENOUS

## 2021-09-19 MED ORDER — 0.9 % SODIUM CHLORIDE (POUR BTL) OPTIME
TOPICAL | Status: DC | PRN
Start: 2021-09-19 — End: 2021-09-19
  Administered 2021-09-19: 1000 mL

## 2021-09-19 MED ORDER — BISACODYL 10 MG RE SUPP: 10.0000 mg | Freq: Every day | RECTAL | Status: DC | PRN

## 2021-09-19 MED ORDER — DEXAMETHASONE SODIUM PHOSPHATE 10 MG/ML IJ SOLN
INTRAMUSCULAR | Status: DC | PRN
Start: 2021-09-19 — End: 2021-09-19
  Administered 2021-09-19: 10 mg via INTRAVENOUS

## 2021-09-19 MED ORDER — PROPOFOL 10 MG/ML IV BOLUS
INTRAVENOUS | Status: DC | PRN
Start: 2021-09-19 — End: 2021-09-19
  Administered 2021-09-19: 150 mg via INTRAVENOUS

## 2021-09-19 MED ORDER — FENTANYL BOLUS VIA INFUSION
50.0000 ug | INTRAVENOUS | Status: DC | PRN
  Filled 2021-09-19: qty 100

## 2021-09-19 MED ORDER — DEXMEDETOMIDINE (PRECEDEX) IN NS 20 MCG/5ML (4 MCG/ML) IV SYRINGE
PREFILLED_SYRINGE | INTRAVENOUS | Status: AC
  Filled 2021-09-19: qty 5

## 2021-09-19 MED ORDER — FENTANYL CITRATE (PF) 100 MCG/2ML IJ SOLN
50.0000 ug | Freq: Once | INTRAMUSCULAR | Status: AC
  Administered 2021-09-19: 50 ug via INTRAVENOUS

## 2021-09-19 MED ORDER — PROPOFOL 1000 MG/100ML IV EMUL
INTRAVENOUS | Status: AC
  Filled 2021-09-19: qty 100

## 2021-09-19 MED ORDER — ROCURONIUM BROMIDE 10 MG/ML (PF) SYRINGE
PREFILLED_SYRINGE | INTRAVENOUS | Status: DC | PRN
Start: 2021-09-19 — End: 2021-09-19
  Administered 2021-09-19: 40 mg via INTRAVENOUS
  Administered 2021-09-19: 50 mg via INTRAVENOUS

## 2021-09-19 MED ORDER — PROPOFOL 500 MG/50ML IV EMUL
INTRAVENOUS | Status: DC | PRN
  Administered 2021-09-19: 50 ug/kg/min via INTRAVENOUS

## 2021-09-19 MED ORDER — ARFORMOTEROL TARTRATE 15 MCG/2ML IN NEBU
15.0000 ug | INHALATION_SOLUTION | Freq: Two times a day (BID) | RESPIRATORY_TRACT | Status: DC
  Administered 2021-09-19 – 2021-09-23 (×8): 15 ug via RESPIRATORY_TRACT
  Filled 2021-09-19 (×10): qty 2

## 2021-09-19 MED ORDER — LACTATED RINGERS IV BOLUS
1000.0000 mL | Freq: Once | INTRAVENOUS | Status: AC
  Administered 2021-09-19: 1000 mL via INTRAVENOUS

## 2021-09-19 MED ORDER — MIDAZOLAM HCL 2 MG/2ML IJ SOLN
INTRAMUSCULAR | Status: DC | PRN
Start: 2021-09-19 — End: 2021-09-19
  Administered 2021-09-19: .5 mg via INTRAVENOUS
  Administered 2021-09-19: 1 mg via INTRAVENOUS
  Administered 2021-09-19: .5 mg via INTRAVENOUS

## 2021-09-19 MED ORDER — CHLORHEXIDINE GLUCONATE CLOTH 2 % EX PADS
6.0000 | MEDICATED_PAD | Freq: Every day | CUTANEOUS | Status: DC
  Administered 2021-09-19 – 2021-09-23 (×5): 6 via TOPICAL

## 2021-09-19 MED ORDER — POTASSIUM CHLORIDE 10 MEQ/100ML IV SOLN
10.0000 meq | INTRAVENOUS | Status: AC
  Administered 2021-09-19 (×4): 10 meq via INTRAVENOUS
  Filled 2021-09-19: qty 100

## 2021-09-19 SURGICAL SUPPLY — 46 items
BAG COUNTER SPONGE SURGICOUNT (BAG) IMPLANT
BAG SPNG CNTER NS LX DISP (BAG)
BLADE SURG 15 STRL LF DISP TIS (BLADE) ×2 IMPLANT
BLADE SURG 15 STRL SS (BLADE) ×6
BNDG GAUZE ELAST 4 BULKY (GAUZE/BANDAGES/DRESSINGS) ×2 IMPLANT
BUR CROSS CUT FISSURE 1.6 (BURR) ×3 IMPLANT
CANISTER SUCT 3000ML PPV (MISCELLANEOUS) ×3 IMPLANT
COVER SURGICAL LIGHT HANDLE (MISCELLANEOUS) ×3 IMPLANT
DECANTER SPIKE VIAL GLASS SM (MISCELLANEOUS) ×2 IMPLANT
DRAIN PENROSE 0.25X12 (DRAIN) ×1 IMPLANT
DRAPE U-SHAPE 76X120 STRL (DRAPES) ×2 IMPLANT
ELECT COATED BLADE 2.86 ST (ELECTRODE) ×1 IMPLANT
ELECT PENCIL ROCKER SW 15FT (MISCELLANEOUS) ×1 IMPLANT
GAUZE PACKING FOLDED 2  STR (GAUZE/BANDAGES/DRESSINGS) ×2
GAUZE PACKING FOLDED 2 STR (GAUZE/BANDAGES/DRESSINGS) ×2 IMPLANT
GAUZE SPONGE 4X4 12PLY STRL LF (GAUZE/BANDAGES/DRESSINGS) ×3 IMPLANT
GLOVE BIO SURGEON STRL SZ8 (GLOVE) ×3 IMPLANT
GLOVE BIOGEL PI IND STRL 8 (GLOVE) IMPLANT
GLOVE BIOGEL PI INDICATOR 8 (GLOVE)
GOWN STRL REUS W/ TWL LRG LVL3 (GOWN DISPOSABLE) ×2 IMPLANT
GOWN STRL REUS W/ TWL XL LVL3 (GOWN DISPOSABLE) ×2 IMPLANT
GOWN STRL REUS W/TWL LRG LVL3 (GOWN DISPOSABLE) ×2
GOWN STRL REUS W/TWL XL LVL3 (GOWN DISPOSABLE) ×2
IV NS 1000ML (IV SOLUTION) ×2
IV NS 1000ML BAXH (IV SOLUTION) ×2 IMPLANT
KIT BASIN OR (CUSTOM PROCEDURE TRAY) ×3 IMPLANT
KIT TURNOVER KIT B (KITS) ×3 IMPLANT
NDL HYPO 25GX1X1/2 BEV (NEEDLE) ×4 IMPLANT
NEEDLE HYPO 25GX1X1/2 BEV (NEEDLE) ×2 IMPLANT
NS IRRIG 1000ML POUR BTL (IV SOLUTION) ×3 IMPLANT
PAD ARMBOARD 7.5X6 YLW CONV (MISCELLANEOUS) ×3 IMPLANT
SLEEVE IRRIGATION ELITE 7 (MISCELLANEOUS) ×2 IMPLANT
SPONGE SURGIFOAM ABS GEL 12-7 (HEMOSTASIS) IMPLANT
STAPLER SKIN 35 WIDE (STAPLE) ×1 IMPLANT
SUT CHROMIC 3 0 PS 2 (SUTURE) ×2 IMPLANT
SUT CHROMIC 3 0 SH 27 (SUTURE) ×1 IMPLANT
SUT ETHILON 2 0 FS 18 (SUTURE) ×2 IMPLANT
SWAB COLLECTION DEVICE MRSA (MISCELLANEOUS) ×1 IMPLANT
SWAB CULTURE ESWAB REG 1ML (MISCELLANEOUS) ×1 IMPLANT
SYR BULB IRRIG 60ML STRL (SYRINGE) ×3 IMPLANT
SYR CONTROL 10ML LL (SYRINGE) ×3 IMPLANT
TRAY ENT MC OR (CUSTOM PROCEDURE TRAY) ×3 IMPLANT
TRAY FOLEY MTR SLVR 14FR STAT (SET/KITS/TRAYS/PACK) ×1 IMPLANT
TUBE SALEM SUMP 16 FR W/ARV (TUBING) ×3 IMPLANT
TUBING IRRIGATION (MISCELLANEOUS) ×2 IMPLANT
YANKAUER SUCT BULB TIP NO VENT (SUCTIONS) ×3 IMPLANT

## 2021-09-19 NOTE — ED Notes (Signed)
Pt depart with [facility] 

## 2021-09-19 NOTE — Consult Note (Signed)
? ?NAME:  Carolyn Black, MRN:  [id number], DOB:  March 09, 1961, LOS: 1 ?ADMISSION DATE:  09/18/2021, CONSULTATION DATE:  09/19/21 ?REFERRING MD:     CHIEF COMPLAINT:  dental abscess   ? ?History of Present Illness:  ?HPI obtained from medical chart review as patient is sedated and intubated on MV.  ? ?61 year old female with prior history of tobacco abuse, COPD, RA, and psoriasis who presented 5/12 with worsening facial swelling and left dental abscess.  Seen 1 week ago for same, placed on clindamycin.  Followed up with dental on 5/9 and changed to augmentin, however symptoms progressive.  Now with chills, ?subjective fevers, worsening pain, trismus, and facial edema with poor PO intake.   ER evaluation with CT showing fluid collections/ cellulitis with left submandibular, sublingual, and masticator space involvement with minimal airway deviation.  Dr. Ross Marcus consulted, admitted to [facility], and taken to OR this morning for I&D after awake fiberoptic intubation.  Patient returns to ICU intubated.  Left intubated for airway protection for at least 24 hours with plans for repeat CT given concern for possible mandibular osteomyelitis as abscesses were more posteriorly located and not in close proximity to teeth.  PCCM consulted for further medical and vent management in ICU.   ? ?Pertinent  Medical History  ?RA, tobacco abuse, COPD, psoriasis ? ?Significant Hospital Events: ?Including procedures, antibiotic start and stop dates in addition to other pertinent events   ?5/12 admitted [facility], oral surgery consulted, cultures, CTX/ flagyl  ? ?Interim History / Subjective:  ?Arrived to ICU on propofol and low dose Neo since stopped.  S/p rocuronium with anesthesia ? ?Objective   ?Blood pressure (!) 161/72, pulse 71, temperature 98.8 ?F (37.1 ?C), temperature source Oral, resp. rate 20, height  (1.6 m), weight 71.7 kg, SpO2 100 %. ?   ?Vent Mode: PRVC ?FiO2 (%):  [100 %] 100 % ?Set Rate:  [18 bmp] 18 bmp ?Vt Set:  [410 mL] 410  mL ?PEEP:  [5 cmH20] 5 cmH20 ?Plateau Pressure:  [15 cmH20] 15 cmH20  ? ?Intake/Output Summary (Last 24 hours) at 09/19/2021 1112 ?Last data filed at 09/19/2021 1000 ?Gross per 24 hour  ?Intake 1200 ml  ?Output 50 ml  ?Net 1150 ml  ? ?Filed Weights  ? 09/18/21 1145 09/19/21 0656  ?Weight: 71.7 kg 71.7 kg  ? ? ?Examination: ?General:  critically ill adult female sedated on MV in NAD ?HEENT: MM pink/moist, ETT 7 at 19 at right corner, no OGT, pupils 2/sluggish, large bulky dressing to left face/ mandibular area with some bloody drainage to upper and chin area of dressing, underlying penrose drains (3) ?Neuro: sedated, s/p paralytics  ?CV: rr, NSR, no murmur ?PULM:  MV supported breaths, insp wheeze louder on R, diminished  ?GI: soft, bs+, foley- cyu ?Extremities: warm/dry, no LE edema  ?Skin: patchy plaques to knees ? ?Labs:  ?Na 132 ?K 2.6 ?sCr 0.66 ?WBC 7.2 ? ? ?5/12 CT soft tissue/ neck >  ?1. Widespread, likely odontogenic infection involving the left submandibular and left masticator spaces with a large abscess and myositis extending from the masticator space into the scalp with superior extent not included on this study. ?2. Aortic Atherosclerosis and Emphysema  ?Addendum: Mild soft tissue thickening involving the left aspect of the ?epiglottis, tongue base, and lateral oropharynx. This may be ?inflammatory, however direct visualization is recommended to exclude ?a mass. ? ?Resolved Hospital Problem list   ? ?Assessment & Plan:  ? 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  Will consider PICC pending BC's for possible longterm abx.  Continue ctx/ flagyl for now ?- follow cx sent for anaerobes, aerobes, fungal ?- trend BG, add SSI if > 180 ? ? ?Acute respiratory insufficiency in the post  operative setting ?Hx COPD ?Tobacco abuse  ?P:  ?- full MV support, PRVC 8 cc/kg IBW with goal Pplat <30 and DP<15  ?- VAP prevention protocol/ PPI ?- PAD protocol for sedation> propofol/ fent, RASS goal -4 for now for no manipulation of ETT given concern for airway compromise.  Dulcolax prn for now.  No OGT.  ?-wean FiO2 as able for SpO2 >92%  ?- SBT/ weaning after OK from oral surgery  ?- CXR> advance ETT x 1cm  ?- ABG pending ?- adding duonebs, prn albuterol, with brovanna/ pulmicort  ? ? ?Hypokalemia ?Hypomag ?P:  ?- recheck BMET/ Mag now and replete  ? ? ?Hyponatremia  ?P:  ?- suspected hypovolemia given hx of poor intake  ?- LR bolus and continue MIVF ?- check UA  ?- trend on BMET ? ? ?Best Practice (right click and "Reselect all SmartList Selections" daily)  ? ?Diet/type: NPO ?DVT prophylaxis: LMWH ?GI prophylaxis: PPI ?Lines: N/A ?Foley:  Yes, and it is still needed ?Code Status:  full code ?Last date of multidisciplinary goals of care discussion [pending] ? ?Labs   ?CBC: ?Recent Labs  ?Lab 09/18/21 ?1505 09/19/21 ?0416 09/19/21 ?4098  ?WBC 7.1 7.2  --   ?NEUTROABS 5.0 2.9  --   ?HGB 11.4* 14.5 11.6*  ?HCT 33.6* 42.0 34.0*  ?MCV 89.1 85.0  --   ?PLT 229 250  --   ? ? ?Basic Metabolic Panel: ?Recent Labs  ?Lab 09/18/21 ?1505 09/19/21 ?0416 09/19/21 ?1191  ?NA 132* 132* 132*  ?K 2.3* 2.6* 2.9*  ?CL 91* 94* 89*  ?CO2 30 30  --   ?GLUCOSE 134* 129* 124*  ?BUN ?CREATININE 0.74 0.66 0.70  ?CALCIUM 8.3* 8.2*  --   ?MG 1.4*  1.5*  --   --   ? ?GFR: ?Estimated Creatinine Clearance: 71 mL/min (by C-G formula based on SCr of 0.7 mg/dL). ?Recent Labs  ?Lab 09/18/21 ?1505 09/19/21 ?0416  ?WBC 7.1 7.2  ? ? ?Liver Function Tests: ?Recent Labs  ?Lab 09/18/21 ?1505 09/19/21 ?0416  ?AST 12* 11*  ?ALT 15 13  ?ALKPHOS 97 88  ?BILITOT 0.7 0.4  ?PROT 6.5 6.3*  ?ALBUMIN 2.7* 2.4*  ? ?No results for input(s): LIPASE, AMYLASE in the last 168 hours. ?No results for input(s): AMMONIA in the last 168 hours. ? ?ABG ?    ?Component Value Date/Time  ? TCO2 31 09/19/2021 0816  ?  ? ?Coagulation Profile: ?No results for input(s): INR, PROTIME in the last 168 hours. ? ?Cardiac Enzymes: ?No results for input(s): CKTOTAL, CKMB, CKMBINDEX, TROPONINI in the last 168 hours. ? ?HbA1C: ?No results found for: HGBA1C ? ?CBG: ?No results for input(s): GLUCAP in the last 168 hours. ? ?Review of Systems:   ?unable ? ?Past Medical History:  ?She,  has a past medical history of Anxiety, COPD (chronic obstructive pulmonary disease) (HCC), Depression, Dyspnea, Psoriasis, Rheumatoid arteritis (HCC), and Seizures (HCC).  ? ?Surgical History:  ? ?Past Surgical History:  ?Procedure Laterality Date  ? KNEE SURGERY Right   ?  LAPAROSCOPIC HYSTERECTOMY    ?  ? ?Social History:  ? reports that she has been smoking cigarettes. She has a 40.00 pack-year smoking history. She has never used smokeless tobacco.  ? ?Family History:  ?Her family history is not on file.  ? ?Allergies ?No Known Allergies  ? ?Home Medications  ?Prior to Admission medications   ?Medication Sig Start Date End Date Taking? Authorizing Provider  ?ADVAIR DISKUS 250-50 MCG/ACT AEPB Inhale 1 puff into the lungs 2 (two) times daily. 09/01/21  Yes [provider]  ?albuterol (VENTOLIN HFA) 108 (90 Base) MCG/ACT inhaler Inhale into the lungs. 09/02/21  Yes [provider]  ?ALPRAZolam Prudy Feeler(XANAX) 0.25 MG tablet Take 0.25 mg by mouth 2 (two) times daily as needed for anxiety.   Yes [provider]  ?amoxicillin-clavulanate (AUGMENTIN) 875-125 MG tablet Take 1 tablet by mouth 2 (two) times daily. 09/16/21  Yes [provider]  ?cetirizine (ZYRTEC) 10 MG tablet Take 10 mg by mouth daily. 08/28/21  Yes [provider]  ?Cholecalciferol (VITAMIN D3 PO) Take 50,000 Units by mouth See admin instructions. 1 tablet weekly on Saturdays   Yes [provider]  ?clindamycin (CLEOCIN) 300 MG capsule Take 300 mg by mouth 3 (three) times daily. 09/14/21  Yes [provider]  ?diclofenac (VOLTAREN) 75 MG EC tablet Take 75 mg by mouth 2 (two) times daily.   Yes [provider]  ?diphenhydrAMINE (BENADRYL) 25 MG tablet Take 25 mg by mouth at bedtime as needed for slee

## 2021-09-19 NOTE — Assessment & Plan Note (Signed)
Nicotine replacement therapy!

## 2021-09-19 NOTE — ED Notes (Signed)
Pt given mouth swab with mouth gel per request. ?

## 2021-09-19 NOTE — Assessment & Plan Note (Addendum)
Status post I&D of submandibular and sublingual abscesses ?Swelling much reduced.  ? ?-Safe to extubate.  ?

## 2021-09-19 NOTE — Assessment & Plan Note (Addendum)
Returns from the OR intubated due to extensive airway edema and possible compromise. ?This morning patient awake and following commands ?Tolerating SBT.  ?Spoke with OMFS - CT and examinationfavorable ? ?- Extubate today.  ?- Start soft diet today.  ? ?

## 2021-09-19 NOTE — Consult Note (Signed)
?  [facility] for Infectious Disease  ? ? ?Date of Admission:  09/18/2021    ? ?Reason for Consult: Dental infection ?    ?Referring Physician: PCCM ? ?Current antibiotics: ?Ceftriaxone 5/12 - present ?MTZ 5/12 - present ? ? ?ASSESSMENT:   ? ?61 y.o. female admitted with: ? 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  Currently on ceftriaxone and metronidazole with further imaging pending. ?Sepsis: Secondary to #1. ?Ventilator dependent respiratory failure: Remains intubated in the ICU for airway protection following the OR. ?Psoriatic arthritis: Noted in PMHx.  Not on any immune modulating agents.  ? ?RECOMMENDATIONS:   ? ?Continue antibiotics ?We will continue ceftriaxone and metronidazole at this time pending cultures ?Follow-up CT maxillofacial with contrast ?Follow cultures ?Lab monitoring ?Vent management per critical care ?Will follow ? ? ?Principal Problem: ?  Dental abscess ?Active Problems: ?  COPD (chronic obstructive pulmonary disease) ([place]) ?  Tobacco abuse counseling ?  Psoriatic arthritis ([place]) ?  Hypokalemia ?  Supraglottic airway obstruction ?  Acute respiratory failure with hypoxia ([place]) ? ? ?MEDICATIONS:   ? ?Scheduled Meds: ? arformoterol  15 mcg Nebulization BID  ? budesonide (PULMICORT) nebulizer solution  0.5 mg Nebulization BID  ? chlorhexidine gluconate (MEDLINE KIT)  15 mL Mouth Rinse BID  ? Chlorhexidine Gluconate Cloth  6 each Topical Daily  ? enoxaparin (LOVENOX) injection  40 mg Subcutaneous Q24H  ? ipratropium-albuterol  3 mL Nebulization Q4H  ? mouth rinse  15 mL Mouth Rinse 10 times per day  ? nicotine  21 mg Transdermal Daily  ? pantoprazole (PROTONIX) IV  40 mg Intravenous Q24H  ? ?Continuous Infusions: ? cefTRIAXone (ROCEPHIN)  IV Stopped (09/18/21 1629)  ? And  ? metronidazole Stopped (09/18/21 1636)  ? dextrose 5 % and 0.45 %  NaCl with KCl 20 mEq/L 75 mL/hr at 09/19/21 1105  ? fentaNYL infusion INTRAVENOUS 50 mcg/hr (09/19/21 1222)  ? propofol (DIPRIVAN) infusion 40 mcg/kg/min (09/19/21 1321)  ? ?PRN Meds:.albuterol, bisacodyl, fentaNYL, ondansetron **OR** ondansetron (ZOFRAN) IV ? ?HPI:   ? ?Carolyn Black is a 61 y.o. female with a past medical history as noted below who presented to the hospital 09/18/2021 with pain and swelling on the left side of her face.  She is currently intubated and sedated in the intensive care unit and so history was obtained via chart review.  This pain and swelling reportedly began over 1 week ago.  She had been seen initially in the ER for the symptoms and placed on antibiotic coverage with clindamycin.  According to the patient, she had worsening pain and was reevaluated by her dentist later that week.  She was switched to Augmentin at that time, however, her symptoms progressively worsened despite this antibiotic coverage.  She presented to the emergency department here where she was found to be afebrile and hemodynamically stable.  There was no evidence of leukocytosis and creatinine was 0.7.  She had a CT of the neck showing widespread odontogenic infection involving the left-sided submandibular and left masticator spaces with large abscess and myositis.  She was taken to the OR earlier today with oral surgery.  She had I&D of several abscesses.  There was also concern for mandibular osteomyelitis given that the abscesses were more posteriorly located and not in close proximity to the teeth.  A CT maxillofacial with contrast is currently pending cultures are pending from the OR.  She is currently on ceftriaxone and metronidazole. ? ? ?Past Medical History:  ?Diagnosis Date  ? Anxiety   ? COPD (chronic obstructive pulmonary disease) ([place])   ? Depression   ? Dyspnea   ? Psoriasis   ? Rheumatoid arteritis ([place])   ? Seizures ([place])   ? ? ?Social History  ? ?Tobacco Use  ? Smoking status: Every Day  ?   Packs/day: 1.00  ?  Years: 40.00  ?  Pack years: 40.00  ?  Types: Cigarettes  ? Smokeless tobacco: Never  ?Vaping Use  ? Vaping Use: Every day  ? ? ?History reviewed. No pertinent family history. ? ?No Known Allergies ? ?Review of Systems  ?Unable to perform ROS: Intubated  ? ?OBJECTIVE:  ? ?Blood pressure (!) 161/72, pulse 71, temperature 98.7 ?F (37.1 ?C), temperature source Axillary, resp. rate 20, height '5\' 3"'  (1.6 m), weight 71.7 kg, SpO2 100 %. ?Body mass index is 27.99 kg/m?. ? ?Physical Exam ?Constitutional:   ?   Comments: Ill-appearing woman, lying in the ICU, and intubated, sedated  ?HENT:  ?   Head:  ?   Comments: Left-sided facial swelling ?   Mouth/Throat:  ?   Comments: Endotracheal tube in place ?Eyes:  ?   Extraocular Movements: Extraocular movements intact.  ?   Conjunctiva/sclera: Conjunctivae normal.  ?Cardiovascular:  ?   Rate and Rhythm: Normal rate and regular rhythm.  ?Pulmonary:  ?   Comments: Ventilated breath sounds, slightly diminished at the bases. ?Abdominal:  ?   General: There is no distension.  ?   Palpations: Abdomen is soft.  ?   Tenderness: There is no abdominal tenderness.  ?Musculoskeletal:  ?   Cervical back: Neck supple.  ?   Right lower leg: No edema.  ?   Left lower leg: No edema.  ?Skin: ?   General: Skin is warm and dry.  ?   Findings: No rash.  ?Neurological:  ?   Comments: Intubated, sedated  ? ? ? ?Lab Results: ?Lab Results  ?Component Value Date  ? WBC 7.2 09/19/2021  ? HGB 9.9 (L) 09/19/2021  ? HCT 29.0 (L) 09/19/2021  ? MCV 85.0 09/19/2021  ? PLT 250 09/19/2021  ?  ?Lab Results  ?Component Value Date  ? NA 133 (L) 09/19/2021  ? K 2.9 (L) 09/19/2021  ? CO2 30 09/19/2021  ? GLUCOSE 124 (H) 09/19/2021  ? BUN 9 09/19/2021  ? CREATININE 0.70 09/19/2021  ? CALCIUM 8.2 (L) 09/19/2021  ? GFRNONAA >60 09/19/2021  ?  ?Lab Results  ?Component Value Date  ? ALT 13 09/19/2021  ? AST 11 (L) 09/19/2021  ? ALKPHOS 88 09/19/2021  ? BILITOT 0.4 09/19/2021  ? ? ?No results found for:  CRP ? ?No results found for: ESRSEDRATE ? ?I have reviewed the micro and lab results in Epic. ? ?Imaging: ?CT Soft Tissue Neck W Contrast ? ?Addendum Date: 09/18/2021   ?ADDENDUM REPORT: 09/18/2021 17:26 ADDENDUM: Mild soft tissue thickening involving the left aspect of the epiglottis, tongue base, and lateral oropharynx. This may be inflammatory, however direct visualization is recommended to exclude a mass. Electronically Signed   By: Logan Bores M.D.   On: 09/18/2021 17:26  ? ?Result Date: 09/18/2021 ?CLINICAL DATA:  Left-sided facial soft tissue swelling. Infection suspected. EXAM: CT NECK WITH CONTRAST TECHNIQUE: Multidetector CT imaging of the neck was performed using the standard protocol following the bolus administration of intravenous contrast. RADIATION DOSE REDUCTION: This exam was performed according to the  departmental dose-optimization program which includes automated exposure control, adjustment of the mA and/or kV according to patient size and/or use of iterative reconstruction technique. CONTRAST:  67m OMNIPAQUE IOHEXOL 300 MG/ML  SOLN COMPARISON:  None Available. FINDINGS: Pharynx and larynx: Asymmetric, mild thickening involving the left aspect of the epiglottis, left tongue base, and left lateral oropharyngeal wall. No retropharyngeal fluid collection. Salivary glands: Marked asymmetric atrophy of the right submandibular gland. Mass effect on and possibly secondary inflammation of the left parotid gland by the process described below. Thyroid: Unremarkable. Lymph nodes: Mild asymmetric enlargement of left level I and II lymph nodes measuring up to 1 cm in short axis, likely reactive. Vascular: Major vascular structures of the neck are grossly patent. Mild atherosclerotic plaque at the carotid bifurcations. Limited intracranial: Unremarkable. Visualized orbits: Unremarkable. Mastoids and visualized paranasal sinuses: Clear. Skeleton: Largely absent dentition. Caries and periapical lucencies  involving the remaining mandibular incisors. Asymmetric left temporomandibular arthritis. Mild cervical spondylosis. Upper chest: Emphysema. Other: There is inflammation within the soft tissues surrounding the left ma

## 2021-09-19 NOTE — Assessment & Plan Note (Signed)
COPD by history but no signs of exacerbation at this time. ? ?-Pulmicort DuoNeb and Brovana. ?

## 2021-09-19 NOTE — Op Note (Signed)
PRE-OPERATIVE DIAGNOSIS:  Odontogenic infection ?POST-OPERATIVE DIAGNOSIS:  Same  ?  ?SURGEON:  Surgeon(s) and Role: ?   Ross Marcus, Lavell Anchors, DMD - Primary ?  ?Procedure: ?Surgical removal #[id number] ([id number] x 4) ?Extra-oral I&D of sublingual abscess (19622) ?Extra-oral I&D of submandibular abscess (29798) ?Extra-oral I&D of masticator space abscess (92119) ?  ?Description of Operation/Procedure: ?  ?The patient was encountered in [facility] OR Room 8.  General anesthesia was induced and an oral ETT was secured via awake fiberoptic intubation.  The table was moved slightly away from anesthesia and the patient was properly padded, relieving all pressure points.  A formal time-out was executed.  2% lidocaine with 1:100,000 epinephrine was infiltrated into the proposed surgical sites.  The patient was prepped and draped in the standard sterile fashion and a throat pack was placed.              ?  ?A Cheryll Dessert approach was marked anterior/superior to the helix on the left side and incision was made through skin and subcutaneous tissues. Hemostasis was achieved. The temporal fascia was identified and divided. Blunt dissection was carried into the superficial and deep temporal spaces. Attention was then directed to the upper left. A vestibular incision was made and full thickness flap was developed with dissection up the ZM buttress Randel Pigg approach). The two dissections were connected and a significant amount of purulent drainage was encountered. This was sent for aerobic and anaerobic cultures and fungal. ?  ?Attention was then directed intraorally to the lower left.  A full thickness mucoperiosteal flap was elevated on both the buccal and lingual extending up the ramus.  Teeth #[id number] were removed with standard elevator and forceps technique without complication. The bone was examined and the cortical bone appeared intact without any loose bony sequestra or signs of advanced osteomyelitis. Granulation tissue around the mandible was  curetted and removed and also submitted for micro. ?  ?Transcutaneous drainage sites were then carefully marked beneath the inferior border of the mandible to allow dependent drainage, avoid neurovascular structures, and to promote esthetics.  Incisions were made through skin and subcutaneous tissues and hemostasis was obtained.  The platysma was identified and divided.  Blunt dissection was carried to the mandible with intraoral digital guidance.  The following spaces were bluntly explored yielding purulent drainage: submandibular, sublingual, and masticator. ?  ?Intraoral access was used to assure proper 1/4'? Penrose drain placement into each fascial space.  A total of 3 drains were placed and secured with 3-0 nylon sutures in the standard fashion to allow for post-surgical advancement when appropriate.  All areas were thoroughly irrigated.  The full thickness mucoperiosteal flap was reapproximated with 3-0 chromic gut suture.  A burn net dressing was fashioned to secure kerlix fluffs over the extraoral drains. ?  ?The oral cavity was suctioned free of all debris and secretions.  The teeth were brushed. The throat pack was removed and an orogastric tube was passed to evacuate the stomach contents.  Sponge and needle counts were correct x 2.  Care of the patient was turned over to the Anesthesia team for uneventful extubation and delivery of the patient to the PACU in stable condition. ?  ?ANESTHESIA:   general ?EBL:  50 mL  ?DRAINS: Penrose drain in the left neck/temple X 3 ?LOCAL MEDICATIONS USED:  LIDOCAINE 2% w/ 1:100000 epi ?SPECIMEN:  Aspirate - cultures for anaerobes, aerobes, and fungal ?PLAN OF CARE: Return to floor ?PATIENT DISPOSITION:  PACU - hemodynamically stable. ?Delay start  of Pharmacological VTE agent (>24hrs) due to surgical blood loss or risk of bleeding: no ?  ?OMFS Recommendations ?-Transfer to ICU intubated for airway protection, plan to keep intubated for 24 hours ?-Please consult  infectious disease for abx recommendations as well as considering PICC line and longterm IV abx for possible underlying osteomyeliti;  ?    --- while infection may have stemmed from existing mandibular teeth these teeth the abscesses were much more posteriorly located and not in close proximity to these teeth and due to air seen on CT in the left mandible concern for spontaneous mandibular osteomyelitis as well as possible etiology ?-Please obtain post op CT maxface w/ contrast  ?-Continue Ceftriaxone/Flagyl ?-Peridex (chlorhexidine) mouthrnise QID ?-Change Kerlix dressing as needed ?-Follow cultures ?-Daily CBC w/ diff ?-Okay with no tube feeds as hopefully plan to be intubated <48 hours ? ?Herbie Saxon, DDS ?Oral and Maxillofacial Surgeon ? Oral Surgery [facility]) ?Office # [phone] ?Cell # [phone] ? ?

## 2021-09-19 NOTE — Progress Notes (Signed)
Patient transported to CT and back. RN at bedside. ?

## 2021-09-19 NOTE — Anesthesia Procedure Notes (Signed)
Procedure Name: Awake intubation ?Date/Time: 09/19/2021 8:47 AM ?Performed by: Jairo Ben, MD ?Pre-anesthesia Checklist: Patient identified, Emergency Drugs available, Suction available and Patient being monitored ?Patient Re-evaluated:Patient Re-evaluated prior to induction ?Oxygen Delivery Method: Circle system utilized ?Preoxygenation: Pre-oxygenation with 100% oxygen ?Laryngoscope Size: Glidescope and 3 ?Grade View: Grade I ?Tube type: [place] flex tip ?Tube size: 7.0 mm ?Number of attempts: 1 ?Airway Equipment and Method: Rigid stylet, Fiberoptic brochoscope and Video-laryngoscopy ?Placement Confirmation: ETT inserted through vocal cords under direct vision, positive ETCO2 and breath sounds checked- equal and bilateral ?Secured at: 22 cm ?Tube secured with: Tape ?Dental Injury: Teeth and Oropharynx as per pre-operative assessment and Bloody posterior oropharynx  ?Comments: Awake fiberoptic via right nare attempted. Pt unable to tolerate. Awake glidescope intubation successful.  ? ? ? ? ?

## 2021-09-19 NOTE — Transfer of Care (Signed)
Immediate Anesthesia Transfer of Care Note ? ?Patient: Carolyn Black ? ?Procedure(s) Performed: INCISION AND DRAINAGE ABSCESS WITH EXTRACTION OF TEETH [phone] (Face) ? ?Patient Location: ICU ? ?Anesthesia Type:General ? ?Level of Consciousness: Patient remains intubated per anesthesia plan ? ?Airway & Oxygen Therapy: Patient remains intubated per anesthesia plan and Patient placed on Ventilator (see vital sign flow sheet for setting) ? ?Post-op Assessment: Post -op Vital signs reviewed and stable ? ?Post vital signs: Reviewed and stable ? ?Last Vitals:  ?Vitals Value Taken Time  ?BP 146/72 09/19/21 1045  ?Temp    ?Pulse 68 09/19/21 1050  ?Resp 18 09/19/21 1050  ?SpO2 100 % 09/19/21 1050  ?Vitals shown include unvalidated device data. ? ?Last Pain:  ?Vitals:  ? 09/19/21 0656  ?TempSrc: Oral  ?PainSc: 9   ?   ? ?  ? ?Complications: No notable events documented. ?

## 2021-09-19 NOTE — Assessment & Plan Note (Signed)
Hypokalemia due to poor oral intake. ? ?-Will replete. ?

## 2021-09-19 NOTE — Assessment & Plan Note (Addendum)
Appears to be etiology of  present abscess. ? ?-Ceftriaxone and Flagyl to cover oral organisms. ?-ID consultation.  ?-OMFS favoring prolonged course of antibiotics rather than further surgery.  ?

## 2021-09-20 DIAGNOSIS — K047 Periapical abscess without sinus: Secondary | ICD-10-CM | POA: Diagnosis not present

## 2021-09-20 DIAGNOSIS — J449 Chronic obstructive pulmonary disease, unspecified: Secondary | ICD-10-CM | POA: Diagnosis not present

## 2021-09-20 DIAGNOSIS — L4050 Arthropathic psoriasis, unspecified: Secondary | ICD-10-CM | POA: Diagnosis not present

## 2021-09-20 DIAGNOSIS — E876 Hypokalemia: Secondary | ICD-10-CM | POA: Diagnosis not present

## 2021-09-20 LAB — RENAL FUNCTION PANEL
Albumin: 1.8 g/dL — ABNORMAL LOW (ref 3.5–5.0)
Anion gap: 7 (ref 5–15)
BUN: 7 mg/dL (ref 6–20)
CO2: 27 mmol/L (ref 22–32)
Calcium: 7.6 mg/dL — ABNORMAL LOW (ref 8.9–10.3)
Chloride: 101 mmol/L (ref 98–111)
Creatinine, Ser: 0.68 mg/dL (ref 0.44–1.00)
GFR, Estimated: >60 mL/min (ref >60)
Glucose, Bld: 161 mg/dL — ABNORMAL HIGH (ref 70–99)
Phosphorus: 4.6 mg/dL (ref 2.5–4.6)
Potassium: 3.4 mmol/L — ABNORMAL LOW (ref 3.5–5.1)
Sodium: 135 mmol/L (ref 135–145)

## 2021-09-20 LAB — GLUCOSE, CAPILLARY
Glucose-Capillary: 116 mg/dL — ABNORMAL HIGH (ref 70–99)
Glucose-Capillary: 124 mg/dL — ABNORMAL HIGH (ref 70–99)
Glucose-Capillary: 161 mg/dL — ABNORMAL HIGH (ref 70–99)
Glucose-Capillary: 165 mg/dL — ABNORMAL HIGH (ref 70–99)
Glucose-Capillary: 186 mg/dL — ABNORMAL HIGH (ref 70–99)
Glucose-Capillary: 225 mg/dL — ABNORMAL HIGH (ref 70–99)
Glucose-Capillary: 84 mg/dL (ref 70–99)
Glucose-Capillary: 85 mg/dL (ref 70–99)

## 2021-09-20 LAB — CBC
HCT: 26.8 % — ABNORMAL LOW (ref 36.0–46.0)
Hemoglobin: 8.9 g/dL — ABNORMAL LOW (ref 12.0–15.0)
MCH: 30.2 pg (ref 26.0–34.0)
MCHC: 33.2 g/dL (ref 30.0–36.0)
MCV: 90.8 fL (ref 80.0–100.0)
Platelets: 278 10*3/uL (ref 150–400)
RBC: 2.95 MIL/uL — ABNORMAL LOW (ref 3.87–5.11)
RDW: 13 % (ref 11.5–15.5)
WBC: 7.8 10*3/uL (ref 4.0–10.5)
nRBC: 0 % (ref 0.0–0.2)

## 2021-09-20 LAB — MAGNESIUM: Magnesium: 2.1 mg/dL (ref 1.7–2.4)

## 2021-09-20 LAB — TRIGLYCERIDES: Triglycerides: 471 mg/dL — ABNORMAL HIGH (ref <150)

## 2021-09-20 MED ORDER — IPRATROPIUM-ALBUTEROL 0.5-2.5 (3) MG/3ML IN SOLN
3.0000 mL | Freq: Three times a day (TID) | RESPIRATORY_TRACT | Status: DC
  Administered 2021-09-21 – 2021-09-22 (×4): 3 mL via RESPIRATORY_TRACT
  Filled 2021-09-20 (×4): qty 3

## 2021-09-20 MED ORDER — POTASSIUM CHLORIDE 10 MEQ/100ML IV SOLN
10.0000 meq | INTRAVENOUS | Status: AC
  Administered 2021-09-20 (×4): 10 meq via INTRAVENOUS
  Filled 2021-09-20 (×2): qty 100

## 2021-09-20 MED ORDER — INSULIN ASPART 100 UNIT/ML IJ SOLN
0.0000 [IU] | INTRAMUSCULAR | Status: DC
  Administered 2021-09-20: 2 [IU] via SUBCUTANEOUS

## 2021-09-20 MED ORDER — TRAMADOL HCL 50 MG PO TABS
50.0000 mg | ORAL_TABLET | Freq: Four times a day (QID) | ORAL | Status: DC | PRN
  Administered 2021-09-20 – 2021-09-21 (×2): 50 mg via ORAL
  Filled 2021-09-20 (×2): qty 1

## 2021-09-20 MED ORDER — DEXMEDETOMIDINE HCL IN NACL 400 MCG/100ML IV SOLN
0.4000 ug/kg/h | INTRAVENOUS | Status: DC
  Administered 2021-09-20: 0.4 ug/kg/h via INTRAVENOUS
  Filled 2021-09-20: qty 100

## 2021-09-20 MED ORDER — ACETAMINOPHEN 325 MG PO TABS
650.0000 mg | ORAL_TABLET | Freq: Four times a day (QID) | ORAL | Status: DC | PRN
  Administered 2021-09-22: 650 mg via ORAL
  Filled 2021-09-20 (×2): qty 2

## 2021-09-20 MED ORDER — ORAL CARE MOUTH RINSE
15.0000 mL | Freq: Two times a day (BID) | OROMUCOSAL | Status: DC
  Administered 2021-09-22 – 2021-09-23 (×2): 15 mL via OROMUCOSAL

## 2021-09-20 MED ORDER — OXYCODONE HCL 5 MG PO TABS
5.0000 mg | ORAL_TABLET | Freq: Four times a day (QID) | ORAL | Status: DC | PRN
  Administered 2021-09-20 – 2021-09-21 (×2): 5 mg via ORAL
  Filled 2021-09-20 (×2): qty 1

## 2021-09-20 NOTE — Procedures (Signed)
Extubation Procedure Note ? ?Patient Details:   ?Name: Carolyn Black ?DOB: 02/06/1961 ?MRN: [id number] ?  ?Airway Documentation:  ?  ?Vent end date: 09/20/21 Vent end time: 1516  ? ?Evaluation ? O2 sats: stable throughout ?Complications: No apparent complications ?Patient did tolerate procedure well. ?Bilateral Breath Sounds: Clear, Diminished ?  ?Yes ? ?Patient extubated per MD order. Positive cuff leak. No stridor noted. Vitals stable on 3L [place]. RN at bedside. ? Harmon Dun? H  ?09/20/2021, 3:17 PM ? ?

## 2021-09-20 NOTE — Progress Notes (Signed)
eLink Physician-Brief Progress Note ?Patient Name: Carolyn Black ?DOB: 12/19/1960 ?MRN: [id number] ? ? ?Date of Service ? 09/20/2021  ?HPI/Events of Note ? PT complaining of pain despite tramadol.  ? ?She is s/p extensive I&D of abscesses 09/19/21.  ?eICU Interventions ? Oxycodone ordered prn.   ? ? ? ?Intervention Category ?Intermediate Interventions: Pain - evaluation and management ? ?Elsie Lincoln ?09/20/2021, 9:10 PM ?

## 2021-09-20 NOTE — Progress Notes (Signed)
? ?NAME:  Carolyn Black, MRN:  [id number], DOB:  07/24/1960, LOS: 2 ?ADMISSION DATE:  09/18/2021, CONSULTATION DATE:  09/19/21 ?REFERRING MD:     CHIEF COMPLAINT:  dental abscess   ? ?History of Present Illness:  ?61 year old female with prior history of tobacco abuse, COPD, RA, and psoriasis who presented 5/12 with worsening facial swelling and left dental abscess.  Seen 1 week ago for same, placed on clindamycin.  Followed up with dental on 5/9 and changed to augmentin, however symptoms progressive.  Now with chills, ?subjective fevers, worsening pain, trismus, and facial edema with poor PO intake.   ER evaluation with CT showing fluid collections/ cellulitis with left submandibular, sublingual, and masticator space involvement with minimal airway deviation.  Dr. Ross MarcusSherwood consulted, admitted to [facility], and taken to OR this morning for I&D after awake fiberoptic intubation.  Patient returns to ICU intubated.  Left intubated for airway protection for at least 24 hours with plans for repeat CT given concern for possible mandibular osteomyelitis as abscesses were more posteriorly located and not in close proximity to teeth.  PCCM consulted for further medical and vent management in ICU.   ? ?Pertinent  Medical History  ?RA, tobacco abuse, COPD, psoriasis ? ?Significant Hospital Events: ?Including procedures, antibiotic start and stop dates in addition to other pertinent events   ?5/12 admitted [facility], oral surgery consulted, cultures, CTX/ flagyl,  ?5/13 OR, returns to ICU intubated, ID consult, CT maxfacial ? ?Interim History / Subjective:  ?Weaning well PSV 5/5 with cuff leak this morning ?No complaints per patient, fully awake on fentanyl gtt 150 mcg/hr ? ?Objective   ?Blood pressure 129/68, pulse 88, temperature 98.3 ?F (36.8 ?C), temperature source Axillary, resp. rate (!) 23, height 5\' 3"  (1.6 m), weight 71.7 kg, SpO2 100 %. ?   ?Vent Mode: PSV;CPAP ?FiO2 (%):  [40 %-100 %] 40 % ?Set Rate:  [18 bmp] 18 bmp ?Vt Set:  [410  mL] 410 mL ?PEEP:  [5 cmH20] 5 cmH20 ?Pressure Support:  [5 cmH20] 5 cmH20 ?Plateau Pressure:  [12 cmH20-18 cmH20] 16 cmH20  ? ?Intake/Output Summary (Last 24 hours) at 09/20/2021 1011 ?Last data filed at 09/20/2021 0900 ?Gross per 24 hour  ?Intake 2724.5 ml  ?Output 3550 ml  ?Net -825.5 ml  ? ?Filed Weights  ? 09/18/21 1145 09/19/21 0656  ?Weight: 71.7 kg 71.7 kg  ? ?Examination: ?General:  Older adult female sitting upright in bed, intubated, no distress, mildly anxious ?HEENT: MM pink/moist, ETT, no OGT, pupils 3/reactive, large bulky left facial dressing w/penrose drains x 3- saturation stable from yesterday  ?Neuro: Awake, frustrated as she wants to be extubated, writes to communicate, MAE, f/c ?CV: ST, no murmur ?PULM:  no labored on PSV 5/5, diminished throughout, no wheeze ?GI: soft, bs+, NT, foley - cyu  ?Extremities: warm/dry, no LE edema  ?Skin: few scattered plagues to extremities  ? ?Labs: K 3.4, sCr 0.68, triglycerides 471, Hgb 11.4> 11.6> 9.9> 8.9, WBC 7.8 ? ?5/12 CT soft tissue/ neck >  ?1. Widespread, likely odontogenic infection involving the left submandibular and left masticator spaces with a large abscess and myositis extending from the masticator space into the scalp with superior extent not included on this study. ?2. Aortic Atherosclerosis and Emphysema  ?Addendum: Mild soft tissue thickening involving the left aspect of the ?epiglottis, tongue base, and lateral oropharynx. This may be ?inflammatory, however direct visualization is recommended to exclude ?a mass. ? ?5/13 CT maxillofacial w/contrast >  ?1. Interval drainage of  abscesses involving the left masticator and submandibular spaces and scalp. Persistent extensive inflammation in these regions without sizable residual undrained fluid collection. ?2. Interval extraction of remaining mandibular teeth. Unchanged few small foci of gas in the left mandibular body, of uncertain etiology and without cortical destruction in this region to  clearly indicate osteomyelitis. ?3. Widening of the left temporomandibular joint with erosion and sclerosis of the left mandibular condyle which could reflect an inflammatory arthritis or septic arthritis. ? ?5/13 MRSA PCR> neg ?5/13 Bcx2 >  ?5/13 aerobic/ anaerobic cx left mandible>  ?                       Left masticator>  ?         Fungal>  ? ?5/12 ceftriaxone>  ?5/14 flagyl >  ? ?Resolved Hospital Problem list   ? ?Assessment & Plan:  ? 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  Continue ctx/ flagyl for now pending cx data ?- trend WBC/ fever curve  ? ?Acute respiratory insufficiency in the post operative setting ?Hx COPD ?Tobacco abuse  ?P:  ?- continue full MV support pending ok for extubation per oral surgery.  Weaning well PSV 5/5 and has cuff leak this morning. ?- changing PAD to precedex and fentanyl (minimize as able) for RASS goal 0/-1 ?- cont duonebs, prn albuterol, with brovanna/ pulmicort  ?- VAP / PPI  ?- smoking cessation when appropriate/ continue nicotine patch  ? ?Hypokalemia ?Hypomag ?P:  ?- Mag 2.1, K 3.4.  replete K IV given no enteral access  ?  ?Hyponatremia  ?P:  ?- suspected hypovolemia given hx of poor intake.  Resolved after fluids.   ?- stop MIVF ?- trend BMET ? ?Hyperglycemia  ?- consistently > 170.  Adding SSI  ?- check A1c although likely reactive ? ?Best Practice (right click and "Reselect all SmartList Selections" daily)  ? ?Diet/type: NPO ?DVT prophylaxis: LMWH ?GI prophylaxis: PPI ?Lines: N/A ?Foley:  Yes, and it is no longer needed and removal ordered  ?Code Status:  full code ?Last date of multidisciplinary goals of care discussion [pending] ? ?Patient, her 2 daughters, and son updated on plan of care at bedside.  ? ?Labs    ?CBC: ?Recent Labs  ?Lab 09/18/21 ?1505 09/19/21 ?0416 09/19/21 ?1610 09/19/21 ?1159 09/20/21 ?9604  ?WBC 7.1 7.2  --   --  7.8  ?NEUTROABS 5.0 2.9  --   --   --   ?HGB 11.4* 14.5 11.6* 9.9* 8.9*  ?HCT 33.6* 42.0 34.0* 29.0* 26.8*  ?MCV 89.1 85.0  --   --  90.8  ?PLT 229 250  --   --  278  ? ? ?Basic Metabolic Panel: ?Recent Labs  ?Lab 09/18/21 ?1505 09/19/21 ?0416 09/19/21 ?5409 09/19/21 ?1105 09/19/21 ?1159 09/20/21 ?8119  ?NA 132* 132* 132* 132* 133* 135  ?K 2.3* 2.6* 2.9* 3.0* 2.9* 3.4*  ?CL 91* 94* 89* 94*  --  101  ?CO2 30 30  --  28  --  27  ?GLUCOSE 134* 129* 124* 153*  --  161*  ?BUN --  7  ?CREATININE 0.74 0.66 0.70 0.70  --  0.68  ?CALCIUM 8.3* 8.2*  --  8.3*  --  7.6*  ?MG 1.4*  1.5*  --   --  1.5*  --  2.1  ?PHOS  --   --   --   --   --  4.6  ? ?GFR: ?Estimated Creatinine Clearance: 71 mL/min (by C-G formula based on SCr of 0.68 mg/dL). ?Recent Labs  ?Lab 09/18/21 ?1505 09/19/21 ?0416 09/20/21 ?1610  ?WBC 7.1 7.2 7.8  ? ? ?Liver Function Tests: ?Recent Labs  ?Lab 09/18/21 ?1505 09/19/21 ?0416 09/19/21 ?1105 09/20/21 ?9604  ?AST 12* 11* 14*  --   ?ALT --   ?ALKPHOS 97 88 77  --   ?BILITOT 0.7 0.4 0.5  --   ?PROT 6.5 6.3* 5.8*  --   ?ALBUMIN 2.7* 2.4* 2.3* 1.8*  ? ?No results for input(s): LIPASE, AMYLASE in the last 168 hours. ?No results for input(s): AMMONIA in the last 168 hours. ? ?ABG ?   ?Component Value Date/Time  ? PHART 7.440 09/19/2021 1159  ? PCO2ART 47.0 09/19/2021 1159  ? PO2ART 72 (L) 09/19/2021 1159  ? HCO3 31.9 (H) 09/19/2021 1159  ? TCO2 33 (H) 09/19/2021 1159  ? O2SAT 94 09/19/2021 1159  ?  ? ?Coagulation Profile: ?No results for input(s): INR, PROTIME in the last 168 hours. ? ?Cardiac Enzymes: ?No results for input(s): CKTOTAL, CKMB, CKMBINDEX, TROPONINI in the last 168 hours. ? ?HbA1C: ?No results found for: HGBA1C ? ?CBG: ?Recent Labs  ?Lab 09/19/21 ?1928 09/19/21 ?2328 09/20/21 ?0307 09/20/21 ?0720 09/20/21 ?5409  ?GLUCAP 180* 186* 225* 165* 161*   ? ? ?Critical care time: 34 mins ?  ? ? ?Posey Boyer, ACNP ?[facility] ?09/20/2021, 10:11 AM ? ?See Amion for pager ?If no response to pager, please call PCCM consult pager ?After 7:00 pm call Elink

## 2021-09-20 NOTE — Progress Notes (Signed)
Progress Note ?  ?  ?ID:  [id number] yoF patient with a left submandibular, sublingual, and masticator space infection s/p I&D of infection with extraction of teeth #23-26 on 09/19/21.  ?  ?Interval History ?5/14- POD 1. NAEON. Remains intubated with sedation weaned, on fent gtt for pain control. ICU reports cuff leak and patient indicating strong want to have ETT removed and desire to eat. Overall reports feeling a little bit better.Postop CT shows appropriate drain placement and drainage of fluid collections. ?  ?Clinical Exam: ?             Drains intact with SS drainage from submandibular drain ?  Extraoral swelling is improved compared to pre-op; photo taken into media tab ?       MIO is 30 mm ?            The floor of mouth is slightly raised on the left side ?            The tongue is no raised ?            Oropharynx is clear ?            Intraorally sutures are intact and no purulence can be appreciated ?  ?  ?Assessment: [age] yoF patient with a left submandibular, sublingual, and masticator space infection s/p I&D of infection with extraction of teeth #23-26 on 09/19/21.  ?  ?  ?Plan: ?-Appreciate medicine's management of patient's systemic health ?  ?OMFS Recommendations ?Molli Knock-Okay for extubation from OMFS perspective ?-Recommend starting diet with full cup liquid after extubation and can progress to soft mechanical diet if well tolerated ?-Peridex (chlorhexidine) mouthrnise QID ?-Change Kerlix dressing as needed ?-Continue Rocephin/Flagyl; appreciate ID recs and will defer to ID on outpatient abx treatment ?-Follow cultures ?  ?  ?  ?Herbie Saxononner , DDS ?Oral and Maxillofacial Surgeon ? Oral Surgery [facility] ?Office # [phone] ?Cell # [phone]  ?

## 2021-09-21 LAB — BASIC METABOLIC PANEL
Anion gap: 8 (ref 5–15)
BUN: 11 mg/dL (ref 6–20)
CO2: 28 mmol/L (ref 22–32)
Calcium: 8.3 mg/dL — ABNORMAL LOW (ref 8.9–10.3)
Chloride: 102 mmol/L (ref 98–111)
Creatinine, Ser: 0.7 mg/dL (ref 0.44–1.00)
GFR, Estimated: >60 mL/min (ref >60)
Glucose, Bld: 84 mg/dL (ref 70–99)
Potassium: 3.3 mmol/L — ABNORMAL LOW (ref 3.5–5.1)
Sodium: 138 mmol/L (ref 135–145)

## 2021-09-21 LAB — GLUCOSE, CAPILLARY
Glucose-Capillary: 70 mg/dL (ref 70–99)
Glucose-Capillary: 73 mg/dL (ref 70–99)
Glucose-Capillary: 127 mg/dL — ABNORMAL HIGH (ref 70–99)
Glucose-Capillary: 140 mg/dL — ABNORMAL HIGH (ref 70–99)
Glucose-Capillary: 96 mg/dL (ref 70–99)

## 2021-09-21 LAB — MAGNESIUM: Magnesium: 1.8 mg/dL (ref 1.7–2.4)

## 2021-09-21 MED ORDER — ENSURE ENLIVE PO LIQD
237.0000 mL | Freq: Three times a day (TID) | ORAL | Status: DC
  Administered 2021-09-21 – 2021-09-23 (×2): 237 mL via ORAL

## 2021-09-21 MED ORDER — ACETAMINOPHEN 500 MG PO TABS
1000.0000 mg | ORAL_TABLET | Freq: Three times a day (TID) | ORAL | Status: DC
  Administered 2021-09-21 – 2021-09-23 (×7): 1000 mg via ORAL
  Filled 2021-09-21 (×7): qty 2

## 2021-09-21 MED ORDER — OXYCODONE HCL 5 MG PO TABS
5.0000 mg | ORAL_TABLET | ORAL | Status: DC | PRN
  Administered 2021-09-21 – 2021-09-22 (×7): 10 mg via ORAL
  Administered 2021-09-22: 5 mg via ORAL
  Administered 2021-09-23 (×3): 10 mg via ORAL
  Filled 2021-09-21 (×11): qty 2

## 2021-09-21 MED ORDER — OXYCODONE HCL 5 MG PO TABS
5.0000 mg | ORAL_TABLET | ORAL | Status: DC | PRN
Start: 2021-09-21 — End: 2021-09-21

## 2021-09-21 MED ORDER — DEXTROSE IN LACTATED RINGERS 5 % IV SOLN: INTRAVENOUS | Status: DC

## 2021-09-21 MED ORDER — POTASSIUM CHLORIDE CRYS ER 20 MEQ PO TBCR
20.0000 meq | EXTENDED_RELEASE_TABLET | ORAL | Status: AC
  Administered 2021-09-21 (×2): 20 meq via ORAL
  Filled 2021-09-21 (×2): qty 1

## 2021-09-21 MED ORDER — SODIUM CHLORIDE 0.9 % IV SOLN
100.0000 mg | INTRAVENOUS | Status: DC
Start: 2021-09-21 — End: 2021-09-22
  Administered 2021-09-22: 100 mg via INTRAVENOUS
  Filled 2021-09-21: qty 5
  Filled 2021-09-21: qty 10
  Filled 2021-09-21: qty 5

## 2021-09-21 MED ORDER — POTASSIUM CHLORIDE 10 MEQ/100ML IV SOLN
10.0000 meq | INTRAVENOUS | Status: AC
  Administered 2021-09-21 (×4): 10 meq via INTRAVENOUS
  Filled 2021-09-21 (×4): qty 100

## 2021-09-21 MED ORDER — ALPRAZOLAM 0.25 MG PO TABS
0.2500 mg | ORAL_TABLET | Freq: Two times a day (BID) | ORAL | Status: DC | PRN
  Administered 2021-09-22: 0.25 mg via ORAL
  Filled 2021-09-21: qty 1

## 2021-09-21 MED ORDER — WHITE PETROLATUM EX OINT
TOPICAL_OINTMENT | CUTANEOUS | Status: DC | PRN
  Administered 2021-09-21: 1 via TOPICAL
  Filled 2021-09-21: qty 28.35

## 2021-09-21 NOTE — Progress Notes (Signed)
Per Conley Simmonds DMD, leave penrose drains open to air ?

## 2021-09-21 NOTE — Progress Notes (Signed)
Initial Nutrition Assessment ? ?DOCUMENTATION CODES:  ? ?Not applicable ? ?INTERVENTION:  ? ?Ensure Enlive po TID, each supplement provides 350 kcal and 20 grams of protein. ? ?When patient is ready, advance to pureed diet, then soft as tolerated.  ? ?NUTRITION DIAGNOSIS:  ? ?Inadequate oral intake related to mouth pain as evidenced by per patient/family report. ? ?GOAL:  ? ?Patient will meet greater than or equal to 90% of their needs ? ?MONITOR:  ? ?PO intake, Supplement acceptance ? ?REASON FOR ASSESSMENT:  ? ?Rounds ?  ? ?ASSESSMENT:  ? ?61 yo female admitted with dental abscess. PMH includes tobacco abuse, COPD, RA, psoriasis. ? ?Discussed patient in ICU rounds and with RN today. ?Patient has been eating poorly d/t facial pain.  ?Plans for transfer out of the ICU today. ? ?Spoke with patient and her daughter at bedside. She c/o being unable to chew because they removed all of her bottom teeth. She does have upper dentures, but not currently wearing them. She agreed to drink Ensure supplements between meals to maximize oral intake. She states that she is not ready for solid foods right now. Discussed pureed diet with her as an option to advance from full liquids when ready.  ? ?Labs reviewed. K 3.3, trig 471 ?CBG: 70-73-127 ? ?Medications reviewed and include IV antibiotics. ? IVF: D5 LR at 50 ml/h ? ?NUTRITION - FOCUSED PHYSICAL EXAM: ? ?Flowsheet Row Most Recent Value  ?Orbital Region No depletion  ?Upper Arm Region No depletion  ?Thoracic and Lumbar Region No depletion  ?Buccal Region Unable to assess  ?Temple Region Unable to assess  ?Clavicle Bone Region No depletion  ?Clavicle and Acromion Bone Region No depletion  ?Scapular Bone Region No depletion  ?Dorsal Hand No depletion  ?Patellar Region No depletion  ?Anterior Thigh Region No depletion  ?Posterior Calf Region No depletion  ?Edema (RD Assessment) None  ?Hair Reviewed  ?Eyes Reviewed  ?Mouth Unable to assess  ?Skin Reviewed  ?Nails Reviewed  ? ?   ? ? ?Diet Order:   ?Diet Order   ? ?       ?  Diet full liquid Room service appropriate? Yes; Fluid consistency: Thin  Diet effective now       ?  ? ?  ?  ? ?  ? ? ?EDUCATION NEEDS:  ? ?No education needs have been identified at this time ? ?Skin:  Skin Assessment: Skin Integrity Issues: ?Skin Integrity Issues:: Other (Comment), Incisions ?Incisions: L face ?Other: R thigh non pressure wound ? ?Last BM:  no BM documented ? ?Height:  ? ?Ht Readings from Last 1 Encounters:  ?09/19/21 5\' 3"  (1.6 m)  ? ? ?Weight:  ? ?Wt Readings from Last 1 Encounters:  ?09/19/21 71.7 kg  ? ? ? ?BMI:  Body mass index is 27.99 kg/m?. ? ?Estimated Nutritional Needs:  ? ?Kcal:  1750-1950 ? ?Protein:  85-100 gm ? ?Fluid:  >/= 1.9 L ? ? ? ?Gabriel RainwaterKimberly H. RD, LDN, CNSC ?Please refer to Amion for contact information.                                                       ? ?

## 2021-09-21 NOTE — Evaluation (Signed)
Physical Therapy Evaluation ?Patient Details ?Name: Carolyn Black ?MRN: [id number] ?DOB: 02/08/1961 ?Today's Date: 09/21/2021 ? ?History of Present Illness ? Pt adm 5/12 with lt sided dental abscess. Underwent I&D and extraction of teeth on 5/13. 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 Remained intubated for 24 hours post op for airway protection and extubated 5/14. PMH - RA, copd, psoriasis.  ?Clinical Impression ? Pt presents to PT with slightly unsteady gait due to illness and inactivity. Expect pt will make good progress back to baseline with mobility. Will follow acutely but doubt pt will need PT after DC.  ?   ?   ? ?Recommendations for follow up therapy are one component of a multi-disciplinary discharge planning process, led by the attending physician.  Recommendations may be updated based on patient status, additional functional criteria and insurance authorization. ? ?Follow Up Recommendations No PT follow up ? ?  ?Assistance Recommended at Discharge Intermittent Supervision/Assistance  ?Patient can return home with the following ? Assist for transportation;Assistance with cooking/housework;A little help with walking and/or transfers ? ?  ?Equipment Recommendations Other (comment) (TBD)  ?Recommendations for Other Services ?    ?  ?Functional Status Assessment Patient has had a recent decline in their functional status and demonstrates the ability to make significant improvements in function in a reasonable and predictable amount of time.  ? ?  ?Precautions / Restrictions Precautions ?Precautions: Fall  ? ?  ? ?Mobility ? Bed Mobility ?  ?  ?  ?  ?  ?  ?  ?General bed mobility comments: Pt up in chair ?  ? ?Transfers ?Overall transfer level: Needs assistance ?Equipment used: 2 person hand held assist ?Transfers: Sit to/from Stand ?Sit to Stand: Min assist ?  ?  ?  ?  ?  ?General transfer comment: Assist to power up ?  ? ?Ambulation/Gait ?Ambulation/Gait assistance: Min assist ?Gait Distance (Feet): 55  Feet ?Assistive device: Rollator (4 wheels), 2 person hand held assist ?Gait Pattern/deviations: Step-through pattern, Decreased stride length, Antalgic, Trunk flexed ?Gait velocity: decr ?Gait velocity interpretation: 1.31 - 2.62 ft/sec, indicative of limited community ambulator ?  ?General Gait Details: Assist for balance and support. Pt with bil foot pain from psoriasis ? ?Stairs ?  ?  ?  ?  ?  ? ?Wheelchair Mobility ?  ? ?Modified Rankin (Stroke Patients Only) ?  ? ?  ? ?Balance Overall balance assessment: Mild deficits observed, not formally tested ?  ?  ?  ?  ?  ?  ?  ?  ?  ?  ?  ?  ?  ?  ?  ?  ?  ?  ?   ? ? ? ?Pertinent Vitals/Pain Pain Assessment ?Pain Assessment: Faces ?Faces Pain Scale: Hurts even more ?Pain Location: jaw, bil feet ?Pain Descriptors / Indicators: Throbbing, Sore ?Pain Intervention(s): Limited activity within patient's tolerance, Patient requesting pain meds-RN notified  ? ? ?Home Living Family/patient expects to be discharged to:: Private residence ?Living Arrangements: Alone ?Available Help at Discharge: Family ?Type of Home: House ?Home Access: Stairs to enter ?Entrance Stairs-Rails: Right ?Entrance Stairs-Number of Steps: 2 ?  ?Home Layout: One level ?Home Equipment: None ?   ?  ?Prior Function Prior Level of Function : Independent/Modified Independent ?  ?  ?  ?  ?  ?  ?Mobility Comments: no assistive device ?  ?  ? ? ?Hand Dominance  ?   ? ?  ?Extremity/Trunk Assessment  ? Upper Extremity Assessment ?Upper Extremity Assessment: Defer to OT  evaluation ?  ? ?Lower Extremity Assessment ?Lower Extremity Assessment: Generalized weakness ?  ? ?   ?Communication  ? Communication: No difficulties  ?Cognition Arousal/Alertness: Awake/alert ?Behavior During Therapy: [facility] for tasks assessed/performed ?Overall Cognitive Status: Within Functional Limits for tasks assessed ?  ?  ?  ?  ?  ?  ?  ?  ?  ?  ?  ?  ?  ?  ?  ?  ?  ?  ?  ? ?  ?General Comments General comments (skin integrity, edema, etc.):  VSS on RA ? ?  ?Exercises    ? ?Assessment/Plan  ?  ?PT Assessment Patient needs continued PT services  ?PT Problem List Decreased strength;Decreased mobility;Pain ? ?   ?  ?PT Treatment Interventions DME instruction;Gait training;Functional mobility training;Therapeutic activities;Therapeutic exercise;Patient/family education;Stair training   ? ?PT Goals (Current goals can be found in the Care Plan section)  ?Acute Rehab PT Goals ?Patient Stated Goal: decr pain ?PT Goal Formulation: With patient ?Time For Goal Achievement: 10/05/21 ?Potential to Achieve Goals: Good ? ?  ?Frequency Min 3X/week ?  ? ? ?Co-evaluation   ?  ?  ?  ?  ? ? ?  ?AM-PAC PT "6 Clicks" Mobility  ?Outcome Measure Help needed turning from your back to your side while in a flat bed without using bedrails?: None ?Help needed moving from lying on your back to sitting on the side of a flat bed without using bedrails?: A Little ?Help needed moving to and from a bed to a chair (including a wheelchair)?: A Little ?Help needed standing up from a chair using your arms (e.g., wheelchair or bedside chair)?: A Little ?Help needed to walk in hospital room?: A Little ?Help needed climbing 3-5 steps with a railing? : A Lot ?6 Click Score: 18 ? ?  ?End of Session   ?Activity Tolerance: Patient tolerated treatment well ?Patient left: in chair;with call bell/phone within reach;with chair alarm set;with family/visitor present ?Nurse Communication: Mobility status ?PT Visit Diagnosis: Other abnormalities of gait and mobility (R26.89);Muscle weakness (generalized) (M62.81);Pain ?Pain - Right/Left:  (bil) ?Pain - part of body: Ankle and joints of foot ?  ? ?Time: 4782-9562 ?PT Time Calculation (min) (ACUTE ONLY): 14 min ? ? ?Charges:   PT Evaluation ?$PT Eval Moderate Complexity: 1 Mod ?  ?  ?   ? ? ?[facility] PT ?Acute Rehabilitation Services ?Office [phone] ? ? ?Angelina Ok [facility] ?09/21/2021, 2:31 PM ? ?

## 2021-09-21 NOTE — Progress Notes (Signed)
[facility] ADULT ICU REPLACEMENT PROTOCOL ? ? ?The patient does apply for the [facility] Adult ICU Electrolyte Replacment Protocol based on the criteria listed below:  ? ?1.Exclusion criteria: TCTS patients, ECMO patients, and Dialysis patients ?2. Is GFR >/= 30 ml/min? Yes.    ?Patient's GFR today is > 60 ?3. Is SCr </= 2? Yes.   ?Patient's SCr is 0.70 mg/dL ?4. Did SCr increase >/= 0.5 in 24 hours? No. ?5.Pt's weight >40kg  Yes.   ?6. Abnormal electrolyte(s): K+ 3.3   ?7. Electrolytes replaced per protocol ?8.  Call MD STAT for K+ </= 2.5, Phos </= 1, or Mag </= 1 ?Physician:  Dr Valora Piccolo ? ?Jeri Modena 09/21/2021 2:59 AM ?

## 2021-09-21 NOTE — Progress Notes (Signed)
Progress Note ?  ?  ?ID:  [id number] yoF patient with a left submandibular, sublingual, and masticator space infection s/p I&D of infection with extraction of teeth #23-26 on 09/19/21.  ?  ?Interval History ?5/14- POD 1. NAEON. Remains intubated with sedation weaned, on fent gtt for pain control. ICU reports cuff leak and patient indicating strong want to have ETT removed and desire to eat. Overall reports feeling a little bit better.Postop CT shows appropriate drain placement and drainage of fluid collections. ?5/15 - POD 2 - Patient reports overall continuing to feel better. The swelling around her mandible feels much better, still with some swelling in her left temple. She has been able to walk around and swallow without difficulty.  ? ?  ?Clinical Exam: ?             Drains intact with minimal SS drainage from submandibular drain; able to express SS fluid around temporal drains with palpation ?  Extraoral swelling is similar to previous exam ?       MIO is 30 mm ?            The floor of mouth is slightly raised on the left side ?            The tongue is not raised ?            Oropharynx is clear ?            Intraorally sutures are intact and no purulence can be appreciated ?  ?  ?Assessment: [age] yoF patient with a left submandibular, sublingual, and masticator space infection s/p I&D of infection with extraction of teeth #23-26 on 09/19/21. Overall patient is showing signs of clinical improvement. ?  ?  ?Plan: ?-Appreciate medicine's management of patient's systemic health ?  ?OMFS Recommendations ?-Okay for soft mechanical diet ?-Peridex (chlorhexidine) mouthrnise QID ?-Change Kerlix dressing as needed ?-Continue Rocephin/Flagyl; appreciate ID recs and will defer to ID on outpatient abx treatment ?-Follow cultures ?  ?  ?  ?Herbie Saxononner , DDS ?Oral and Maxillofacial Surgeon ? Oral Surgery [facility] ?Office # [phone] ?Cell # [phone]  ?

## 2021-09-21 NOTE — Progress Notes (Signed)
? ?NAME:  Carolyn Black, MRN:  [id number], DOB:  09-Feb-1961, LOS: 3 ?ADMISSION DATE:  09/18/2021, CONSULTATION DATE:  09/19/21 ?REFERRING MD:     CHIEF COMPLAINT:  dental abscess   ? ?History of Present Illness:  ?61 year old female with prior history of tobacco abuse, COPD, RA, and psoriasis who presented 5/12 with worsening facial swelling and left dental abscess.  Seen 1 week ago for same, placed on clindamycin.  Followed up with dental on 5/9 and changed to augmentin, however symptoms progressive.  Now with chills, ?subjective fevers, worsening pain, trismus, and facial edema with poor PO intake.   ER evaluation with CT showing fluid collections/ cellulitis with left submandibular, sublingual, and masticator space involvement with minimal airway deviation.  Dr. Ross Marcus consulted, admitted to [facility], and taken to OR this morning for I&D after awake fiberoptic intubation.  Patient returns to ICU intubated.  Left intubated for airway protection for at least 24 hours with plans for repeat CT given concern for possible mandibular osteomyelitis as abscesses were more posteriorly located and not in close proximity to teeth.  PCCM consulted for further medical and vent management in ICU.   ? ?Pertinent  Medical History  ?RA, tobacco abuse, COPD, psoriasis ? ?Significant Hospital Events: ?Including procedures, antibiotic start and stop dates in addition to other pertinent events   ?5/12 admitted TRH, oral surgery consulted, cultures, CTX/ flagyl,  ?5/13 OR, returns to ICU intubated, ID consult, CT maxfacial ? ?Interim History / Subjective:  ?This morning reports significant pain on L side of face. Otherwise no acute events overnight.  ? ?Objective   ?Blood pressure 134/68, pulse 69, temperature 98.3 ?F (36.8 ?C), temperature source Oral, resp. rate 14, height 5\' 3"  (1.6 m), weight 71.7 kg, SpO2 99 %. ?   ?Vent Mode: PSV;CPAP ?FiO2 (%):  [40 %] 40 % ?PEEP:  [5 cmH20] 5 cmH20 ?Pressure Support:  [5 cmH20] 5 cmH20   ? ?Intake/Output Summary (Last 24 hours) at 09/21/2021 0716 ?Last data filed at 09/21/2021 0600 ?Gross per 24 hour  ?Intake 1355.84 ml  ?Output 1100 ml  ?Net 255.84 ml  ? ? ?Filed Weights  ? 09/18/21 1145 09/19/21 0656  ?Weight: 71.7 kg 71.7 kg  ? ?Examination: ?General:  Resting in bed, no acute distress ?HEENT: Large dressing wrapping around head; two drains in tact ?Neuro: Awake, alert, conversing appropriately. Grossly non-focal ?CV: Regular rate, rhythm. No murmurs appreciated.  ?PULM:  Normal respiratory effort on room air. No wheezing appreciated.  ?GI: Abd soft, non-tender, non-distended. ?Extremities: Warm, dry. No peripheral edema. ?Skin: No rashes, lesions appreciated. ? ?Assessment & Plan:  ? ?#L sublingual, submandibular, and masticator space abscesses, POD2 s/p I&D ?#Questionable underlying osteomyelitis ?Pt extubated yesterday, has remained hemodynamically stable, not on any pressors or drips. Appreciate OMFS assistance. Continue with antibiotics, follow-up ID recommendations. Has had minimal po intake today, will supplement with IVF and continue liquid diet as tolerated. She is stable for the floor, TRH to take over 5/16. ?- Flagyl, ceftriaxone, follow-up ID recommendations ?- Follow-up OMFS recommendations ?- Full liquid diet, advance diet as tolerated ?- D5-LR 100cc/hr x12hr ?- Increase oxycodone 5-10mg  every 4 hours as needed for pain ?- TRH to take over 5/16 AM ? ?#History of COPD ?#Tobacco use disorder ?Mild wheezing on exam this morning. Saturating appropriately in low 90's on room air. Will continue with bronchodilators. Needs outpatient follow-up.  ?- Daily Brovana, Pulmicort ?- DuoNebs three times daily ?- Albuterol every 4 hours as needed ?- Needs smoking cessation  counseling and outpatient PFT's ? ?#Hypokalemia ?In setting of poor po intake for the last week. Will continue replete, check daily labs. ? ?#Normocytic anemia ?On arrival Hgb 11.4, last September Hgb normal. Likely in setting of  acute illness. Will need outpatient follow-up for further work-up/check for resolution.  ? ?Best Practice (right click and "Reselect all SmartList Selections" daily)  ? ?Diet/type: full liquids  ?DVT prophylaxis: LMWH ?GI prophylaxis: N/A ?Lines: N/A ?Foley:  N/A ?Code Status:  full code ?Last date of multidisciplinary goals of care discussion [ n/a - case discussed with son at bedside this AM] ? ?Labs   ?CBC: ?Recent Labs  ?Lab 09/18/21 ?1505 09/19/21 ?0416 09/19/21 ?[id number] 09/19/21 ?1159 09/20/21 ?[id number]  ?WBC 7.1 7.2  --   --  7.8  ?NEUTROABS 5.0 2.9  --   --   --   ?HGB 11.4* 14.5 11.6* 9.9* 8.9*  ?HCT 33.6* 42.0 34.0* 29.0* 26.8*  ?MCV 89.1 85.0  --   --  90.8  ?PLT 229 250  --   --  278  ? ? ? ?Basic Metabolic Panel: ?Recent Labs  ?Lab 09/18/21 ?1505 09/19/21 ?0416 09/19/21 ?[id number] 09/19/21 ?1105 09/19/21 ?1159 09/20/21 ?[id number] 09/21/21 ?0121  ?NA 132* 132* 132* 132* 133* 135 138  ?K 2.3* 2.6* 2.9* 3.0* 2.9* 3.4* 3.3*  ?CL 91* 94* 89* 94*  --  101 102  ?CO2 30 30  --  28  --  27 28  ?GLUCOSE 134* 129* 124* 153*  --  161* 84  ?BUN 18 10 9 8   --  7 11  ?CREATININE 0.74 0.66 0.70 0.70  --  0.68 0.70  ?CALCIUM 8.3* 8.2*  --  8.3*  --  7.6* 8.3*  ?MG 1.4*  1.5*  --   --  1.5*  --  2.1  --   ?PHOS  --   --   --   --   --  4.6  --   ? ? ?GFR: ?Estimated Creatinine Clearance: 71 mL/min (by C-G formula based on SCr of 0.7 mg/dL). ?Recent Labs  ?Lab 09/18/21 ?1505 09/19/21 ?0416 09/20/21 ?[id number]  ?WBC 7.1 7.2 7.8  ? ? ? ?Liver Function Tests: ?Recent Labs  ?Lab 09/18/21 ?1505 09/19/21 ?0416 09/19/21 ?1105 09/20/21 ?[id number]  ?AST 12* 11* 14*  --   ?ALT 15 13 10   --   ?ALKPHOS 97 88 77  --   ?BILITOT 0.7 0.4 0.5  --   ?PROT 6.5 6.3* 5.8*  --   ?ALBUMIN 2.7* 2.4* 2.3* 1.8*  ? ? ?No results for input(s): LIPASE, AMYLASE in the last 168 hours. ?No results for input(s): AMMONIA in the last 168 hours. ? ?ABG ?   ?Component Value Date/Time  ? PHART 7.440 09/19/2021 1159  ? PCO2ART 47.0 09/19/2021 1159  ? PO2ART 72 (L) 09/19/2021 1159  ?  HCO3 31.9 (H) 09/19/2021 1159  ? TCO2 33 (H) 09/19/2021 1159  ? O2SAT 94 09/19/2021 1159  ? ?  ? ?Coagulation Profile: ?No results for input(s): INR, PROTIME in the last 168 hours. ? ?Cardiac Enzymes: ?No results for input(s): CKTOTAL, CKMB, CKMBINDEX, TROPONINI in the last 168 hours. ? ?HbA1C: ?No results found for: HGBA1C ? ?CBG: ?Recent Labs  ?Lab 09/20/21 ?1140 09/20/21 ?1534 09/20/21 ?2000 09/20/21 ?2332 09/21/21 ?0409  ?GLUCAP 124* 116* 85 84 70  ? ? ?Critical care time: 30 mins ?  ? ?Evlyn KannerPhillip , MD ?Internal Medicine PGY-2 ?Pager: [phone] ? ? ? ? ? ?

## 2021-09-22 ENCOUNTER — Inpatient Hospital Stay: Payer: Self-pay

## 2021-09-22 DIAGNOSIS — K047 Periapical abscess without sinus: Secondary | ICD-10-CM | POA: Diagnosis not present

## 2021-09-22 LAB — CBC WITH DIFFERENTIAL/PLATELET
Abs Immature Granulocytes: 0.06 10*3/uL (ref 0.00–0.07)
Basophils Absolute: 0 10*3/uL (ref 0.0–0.1)
Basophils Relative: 0 %
Eosinophils Absolute: 0 10*3/uL (ref 0.0–0.5)
Eosinophils Relative: 1 %
HCT: 30.1 % — ABNORMAL LOW (ref 36.0–46.0)
Hemoglobin: 9.9 g/dL — ABNORMAL LOW (ref 12.0–15.0)
Immature Granulocytes: 1 %
Lymphocytes Relative: 23 %
Lymphs Abs: 1.2 10*3/uL (ref 0.7–4.0)
MCH: 30.2 pg (ref 26.0–34.0)
MCHC: 32.9 g/dL (ref 30.0–36.0)
MCV: 91.8 fL (ref 80.0–100.0)
Monocytes Absolute: 0.4 10*3/uL (ref 0.1–1.0)
Monocytes Relative: 8 %
Neutro Abs: 3.5 10*3/uL (ref 1.7–7.7)
Neutrophils Relative %: 67 %
Platelets: 297 10*3/uL (ref 150–400)
RBC: 3.28 MIL/uL — ABNORMAL LOW (ref 3.87–5.11)
RDW: 13.2 % (ref 11.5–15.5)
WBC: 5.2 10*3/uL (ref 4.0–10.5)
nRBC: 0 % (ref 0.0–0.2)

## 2021-09-22 LAB — BASIC METABOLIC PANEL
Anion gap: 8 (ref 5–15)
BUN: 15 mg/dL (ref 6–20)
CO2: 23 mmol/L (ref 22–32)
Calcium: 8.2 mg/dL — ABNORMAL LOW (ref 8.9–10.3)
Chloride: 103 mmol/L (ref 98–111)
Creatinine, Ser: 0.62 mg/dL (ref 0.44–1.00)
GFR, Estimated: >60 mL/min (ref >60)
Glucose, Bld: 93 mg/dL (ref 70–99)
Potassium: 4.1 mmol/L (ref 3.5–5.1)
Sodium: 134 mmol/L — ABNORMAL LOW (ref 135–145)

## 2021-09-22 LAB — GLUCOSE, CAPILLARY
Glucose-Capillary: 103 mg/dL — ABNORMAL HIGH (ref 70–99)
Glucose-Capillary: 104 mg/dL — ABNORMAL HIGH (ref 70–99)
Glucose-Capillary: 105 mg/dL — ABNORMAL HIGH (ref 70–99)
Glucose-Capillary: 108 mg/dL — ABNORMAL HIGH (ref 70–99)
Glucose-Capillary: 122 mg/dL — ABNORMAL HIGH (ref 70–99)
Glucose-Capillary: 88 mg/dL (ref 70–99)

## 2021-09-22 LAB — AEROBIC/ANAEROBIC CULTURE W GRAM STAIN (SURGICAL/DEEP WOUND)

## 2021-09-22 LAB — SEDIMENTATION RATE: Sed Rate: 90 mm/hr — ABNORMAL HIGH (ref 0–22)

## 2021-09-22 LAB — PHOSPHORUS: Phosphorus: 3.5 mg/dL (ref 2.5–4.6)

## 2021-09-22 LAB — C-REACTIVE PROTEIN: CRP: 4.6 mg/dL — ABNORMAL HIGH (ref <1.0)

## 2021-09-22 LAB — MAGNESIUM: Magnesium: 1.6 mg/dL — ABNORMAL LOW (ref 1.7–2.4)

## 2021-09-22 MED ORDER — FLUCONAZOLE 100 MG PO TABS
400.0000 mg | ORAL_TABLET | Freq: Every day | ORAL | Status: DC
  Administered 2021-09-23: 400 mg via ORAL
  Filled 2021-09-22: qty 4

## 2021-09-22 MED ORDER — HYDROMORPHONE HCL 1 MG/ML IJ SOLN
1.0000 mg | INTRAMUSCULAR | Status: DC | PRN
  Administered 2021-09-22 – 2021-09-23 (×4): 1 mg via INTRAVENOUS
  Filled 2021-09-22 (×4): qty 1

## 2021-09-22 MED ORDER — FLUCONAZOLE 100 MG PO TABS
800.0000 mg | ORAL_TABLET | Freq: Once | ORAL | Status: AC
  Administered 2021-09-22: 800 mg via ORAL
  Filled 2021-09-22: qty 8

## 2021-09-22 MED ORDER — SODIUM CHLORIDE 0.9% FLUSH: 10.0000 mL | INTRAVENOUS | Status: DC | PRN

## 2021-09-22 MED ORDER — MAGNESIUM SULFATE 2 GM/50ML IV SOLN
2.0000 g | Freq: Once | INTRAVENOUS | Status: AC
  Administered 2021-09-22: 2 g via INTRAVENOUS
  Filled 2021-09-22: qty 50

## 2021-09-22 MED ORDER — TRIAMCINOLONE ACETONIDE 0.025 % EX CREA
TOPICAL_CREAM | Freq: Two times a day (BID) | CUTANEOUS | Status: DC | PRN
  Filled 2021-09-22 (×2): qty 15

## 2021-09-22 NOTE — Progress Notes (Signed)
Physical Therapy Treatment ?Patient Details ?Name: Carolyn Black ?MRN: [id number] ?DOB: 02/05/1961 ?Today's Date: 09/22/2021 ? ? ?History of Present Illness Pt adm 5/12 with lt sided dental abscess. Underwent I&D and extraction of teeth on 5/13. 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 Remained intubated for 24 hours post op for airway protection and extubated 5/14. PMH - RA, copd, psoriasis. ? ?  ?PT Comments  ? ? Pt received OOB in BR on arrival and agreeable to session with good progress towards goals. Pt min guard down to supervision for all OOB mobility without AD, demonstrating fair balance. Extensive time spent in discussing and educating on increasing activity tolerance slowly, safety with mobility, pacing, environmental awareness during mobility and importance of continued mobility, pt and daughter verbalizing understanding. Pt continues to be limited by pain in B feet and general fatigue. Pt continues to benefit from skilled PT services to progress toward functional mobility goals.   ?  ?Recommendations for follow up therapy are one component of a multi-disciplinary discharge planning process, led by the attending physician.  Recommendations may be updated based on patient status, additional functional criteria and insurance authorization. ? ?Follow Up Recommendations ? No PT follow up ?  ?  ?Assistance Recommended at Discharge Intermittent Supervision/Assistance  ?Patient can return home with the following Assist for transportation;Assistance with cooking/housework;A little help with walking and/or transfers ?  ?Equipment Recommendations ? Other (comment) (TBD)  ?  ?Recommendations for Other Services   ? ? ?  ?Precautions / Restrictions Precautions ?Precautions: Fall ?Restrictions ?Weight Bearing Restrictions: No  ?  ? ?Mobility ? Bed Mobility ?  ?  ?  ?  ?  ?  ?  ?General bed mobility comments: pt up in BR ?  ? ?Transfers ?Overall transfer level: Needs assistance ?Equipment used: None ?Transfers: Sit  to/from Stand ?Sit to Stand: Min guard, Supervision ?  ?  ?  ?  ?  ?General transfer comment: min guard for safety ?  ? ?Ambulation/Gait ?Ambulation/Gait assistance: Min guard, Supervision ?Gait Distance (Feet): 280 Feet ?Assistive device: None ?Gait Pattern/deviations: Step-through pattern, Decreased stride length, Antalgic ?Gait velocity: decr ?  ?  ?General Gait Details: min guard for safety down to supervision, cues for pacing ? ? ?Stairs ?  ?  ?  ?  ?  ? ? ?Wheelchair Mobility ?  ? ?Modified Rankin (Stroke Patients Only) ?  ? ? ?  ?Balance Overall balance assessment: Mild deficits observed, not formally tested ?  ?  ?  ?  ?  ?  ?  ?  ?  ?  ?  ?  ?  ?  ?  ?  ?  ?  ?  ? ?  ?Cognition Arousal/Alertness: Awake/alert ?Behavior During Therapy: [facility]FL for tasks assessed/performed ?Overall Cognitive Status: Within Functional Limits for tasks assessed ?  ?  ?  ?  ?  ?  ?  ?  ?  ?  ?  ?  ?  ?  ?  ?  ?  ?  ?  ? ?  ?Exercises   ? ?  ?General Comments General comments (skin integrity, edema, etc.): VSS on RA ?  ?  ? ?Pertinent Vitals/Pain Pain Assessment ?Pain Assessment: Faces ?Faces Pain Scale: Hurts even more ?Pain Location: jaw, bil feet ?Pain Descriptors / Indicators: Throbbing, Sore ?Pain Intervention(s): Limited activity within patient's tolerance, Monitored during session  ? ? ?Home Living   ?  ?  ?  ?  ?  ?  ?  ?  ?  ?   ?  ?  Prior Function    ?  ?  ?   ? ?PT Goals (current goals can now be found in the care plan section) Acute Rehab PT Goals ?Patient Stated Goal: decr pain ?PT Goal Formulation: With patient ?Time For Goal Achievement: 10/05/21 ? ?  ?Frequency ? ? ? Min 3X/week ? ? ? ?  ?PT Plan    ? ? ?Co-evaluation   ?  ?  ?  ?  ? ?  ?AM-PAC PT "6 Clicks" Mobility   ?Outcome Measure ? Help needed turning from your back to your side while in a flat bed without using bedrails?: None ?Help needed moving from lying on your back to sitting on the side of a flat bed without using bedrails?: A Little ?Help needed moving to  and from a bed to a chair (including a wheelchair)?: A Little ?Help needed standing up from a chair using your arms (e.g., wheelchair or bedside chair)?: A Little ?Help needed to walk in hospital room?: A Little ?Help needed climbing 3-5 steps with a railing? : A Little ?6 Click Score: 19 ? ?  ?End of Session   ?Activity Tolerance: Patient tolerated treatment well ?Patient left: in bed;with call bell/phone within reach;with family/visitor present ?Nurse Communication: Mobility status ?PT Visit Diagnosis: Other abnormalities of gait and mobility (R26.89);Muscle weakness (generalized) (M62.81);Pain ?Pain - Right/Left:  (bil) ?Pain - part of body: Ankle and joints of foot ?  ? ? ?Time: 1610-9604 ?PT Time Calculation (min) (ACUTE ONLY): 13 min ? ?Charges:  $Therapeutic Activity: 8-22 mins          ?          ? ?Carolyn Boys. PTA ?Acute Rehabilitation Services ?Office: [phone] ? ? ? ?Carolyn Black  ?09/22/2021, 12:23 PM ? ?

## 2021-09-22 NOTE — Progress Notes (Signed)
Progress Note ?  ?  ?ID:  77 yoF patient with a left submandibular, sublingual, and masticator space infection s/p I&D of infection with extraction of teeth #23-26 on 09/19/21.  ?  ?Interval History ?5/14- POD 1. NAEON. Remains intubated with sedation weaned, on fent gtt for pain control. ICU reports cuff leak and patient indicating strong want to have ETT removed and desire to eat. Overall reports feeling a little bit better.Postop CT shows appropriate drain placement and drainage of fluid collections. ?5/15 - POD 2 - Patient reports overall continuing to feel better. The swelling around her mandible feels much better, still with some swelling in her left temple. She has been able to walk around and swallow without difficulty.  ?5/16 - POD 3 - Patient continues to endorse slow improvement. Swelling around temple is relatively unchanged and can expressed mixed SS/purulent discharge. No drainage from submandibular drain. ? ?  ?Clinical Exam: ?             Drains intact with minimal SS drainage from submandibular drain; able to express SS fluid around temporal drains with palpation ?  Extraoral swelling is similar to previous exam ?       MIO is 35 mm ?            The floor of mouth is slightly raised on the left side, improving and less erythematous ?            The tongue is not raised ?            Oropharynx is clear ?            Intraorally sutures are intact and no purulence can be appreciated ?  ?  ?Assessment: 32 yoF patient with a left submandibular, sublingual, and masticator space infection s/p I&D of infection with extraction of teeth #23-26 on 09/19/21. Overall patient is showing signs of clinical improvement. ?  ?  ?Plan: ?-Appreciate medicine's management of patient's systemic health ?  ?OMFS Recommendations ?-Okay for soft mechanical diet ?-Peridex (chlorhexidine) mouthrnise QID ?-Change Kerlix dressing as needed ?-Continue Rocephin/Flagyl; appreciate ID recs and will defer to ID on outpatient abx  treatment ?-Follow cultures ?  ?  ?  ?Herbie Saxon, DDS ?Oral and Maxillofacial Surgeon ? Oral Surgery [facility]) ?Office # [phone] ?Cell # [phone]  ?

## 2021-09-22 NOTE — Care Management Important Message (Signed)
Important Message ? ?Patient Details  ?Name: Carolyn Black  ?MRN: [id number] ?Date of Birth: 01/30/1961 ? ? ?Medicare Important Message Given:  Yes ? ? ? ? ?  ?09/22/2021, 3:25 PM ?

## 2021-09-22 NOTE — TOC Initial Note (Signed)
Transition of Care (TOC) - Initial/Assessment Note  ? ? ?Patient Details  ?Name: Carolyn Black ?MRN: [id number] ?Date of Birth: 05/01/1961 ? ?Transition of Care (TOC) CM/SW Contact:    ?Kingsley Plan, RN ?Phone Number: ?09/22/2021, 12:37 PM ? ?Clinical Narrative:                 ?Spoke to patient and daughter Carolyn Black [phone] at bedside.  ? ?Discussed possible IV ABX at home. Discussed PICC line. Infusion company will be [employer] . [employer] has a nurse [name] who will come to the hospital and provide PICC and IV ABX at home education prior to discharge. Patient will have a HHRN however HHRN will not be present every time a dose is due. Patient and Carolyn Black voiced understanding. Carolyn Black will be support person.  ? ?Provided [name] with Tara's number to arrange education.  ? ?Address on face sheet is patient's home address. Patient may discharge to her son's home : [employer] in [place] . Family will discuss. NCM will follow up to see which address is decided on . Will need son's street number if patient discharges to his home . ? ? ?Marylene Land with [facility] accepted referral for home IV ABX.  ? ?Will continue to follow  ? ?Expected Discharge Plan: Home w Home Health Services ?Barriers to Discharge: Continued Medical Work up ? ? ?Patient Goals and CMS Choice ?Patient states their goals for this hospitalization and ongoing recovery are:: to return home ?CMS Medicare.gov Compare Post Acute Care list provided to:: Patient ?Choice offered to / list presented to : Patient ? ?Expected Discharge Plan and Services ?Expected Discharge Plan: Home w Home Health Services ?  ?Discharge Planning Services: CM Consult ?Post Acute Care Choice: Home Health ?Living arrangements for the past 2 months: Single Family Home ?                ?DME Arranged: N/A ?  ?  ?  ?  ?HH Arranged: RN ?HH Agency: [facility] ?Date HH Agency Contacted: 09/22/21 ?Time HH Agency Contacted: 1236 ?Representative spoke with at [facility] Agency: Marylene Land ? ?Prior Living  Arrangements/Services ?Living arrangements for the past 2 months: Single Family Home ?Lives with:: Self ?Patient language and need for interpreter reviewed:: Yes ?Do you feel safe going back to the place where you live?: Yes      ?Need for Family Participation in Patient Care: Yes (Comment) ?Care giver support system in place?: Yes (comment) ?  ?Criminal Activity/Legal Involvement Pertinent to Current Situation/Hospitalization: No - Comment as needed ? ?Activities of Daily Living ?Home Assistive Devices/Equipment: None, Dentures (specify type) ?ADL Screening (condition at time of admission) ?Patient's cognitive ability adequate to safely complete daily activities?: Yes ?Is the patient deaf or have difficulty hearing?: No ?Does the patient have difficulty seeing, even when wearing glasses/contacts?: No ?Does the patient have difficulty concentrating, remembering, or making decisions?: No ?Patient able to express need for assistance with ADLs?: Yes ?Does the patient have difficulty dressing or bathing?: No ?Independently performs ADLs?: Yes (appropriate for developmental age) ?Does the patient have difficulty walking or climbing stairs?: No ?Weakness of Legs: None ?Weakness of Arms/Hands: None ? ?Permission Sought/Granted ?  ?Permission granted to share information with : Yes, Verbal Permission Granted ? Share Information with NAME: daughter Carolyn Black [phone] ? Permission granted to share info w AGENCY: [employer]) ?   ?   ? ?Emotional Assessment ?Appearance:: Appears stated age ?Attitude/Demeanor/Rapport: Engaged ?Affect (typically observed): Accepting ?Orientation: :  Oriented to Self, Oriented to Place, Oriented to  Time, Oriented to Situation ?Alcohol / Substance Use: Not Applicable ?Psych Involvement: No (comment) ? ?Admission diagnosis:  Dental abscess [K04.7] ?Hyponatremia [E87.1] ?Odontogenic infection of jaw [M27.2] ?Abscess [L02.91] ?Patient Active Problem List  ? Diagnosis Date Noted  ?  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 Acute respiratory failure with hypoxia (HCC) 09/19/2021  ? Dental abscess 09/18/2021  ? COPD (chronic obstructive pulmonary disease) (HCC) 09/18/2021  ? Tobacco abuse counseling 09/18/2021  ? Psoriatic arthritis (HCC) 09/18/2021  ? Hypokalemia 09/18/2021  ? ?PCP:  [facility] ?Pharmacy:   ?[id number] - [place], [place] - [address] ?[address] ?[place] [id number] ?Phone: [phone] Fax: [phone] ? ? ? ? ?Social Determinants of Health (SDOH) Interventions ?  ? ?Readmission Risk Interventions ?   ? View : No data to display.  ?  ?  ?  ? ? ? ?

## 2021-09-22 NOTE — Progress Notes (Signed)
Mobility Specialist Progress Note: ? ? 09/22/21 1207  ?Mobility  ?Activity Ambulated with assistance in room;Ambulated with assistance to bathroom  ?Level of Assistance Standby assist, set-up cues, supervision of patient - no hands on  ?Assistive Device None  ?Distance Ambulated (ft) 30 ft  ?Activity Response Tolerated well  ?$Mobility charge 1 Mobility  ? ?Pt received in bed willing to participate in mobility. No Complaints of pain. Ambulated to BR. Left pt with PT to complete her session.  ? ?  ?Mobility Specialist ?Primary Phone [phone] ? ?

## 2021-09-22 NOTE — Progress Notes (Addendum)
? ?[facility] Infectious Diseases Follow Up Note ? ?Patient Identification: ?Patient Name: Carolyn Black MRN: [id number] Admit Date: 09/18/2021 11:49 AM ?Age: 61 y.o.Today's Date: 09/22/2021 ? ?Reason for Visit: Dental infection/osteomyelitis  ? ?Principal Problem: ?  Dental abscess ?Active Problems: ?  COPD (chronic obstructive pulmonary disease) (HCC) ?  Tobacco abuse counseling ?  Psoriatic arthritis (HCC) ?  Hypokalemia ?  Supraglottic airway obstruction ?  Acute respiratory failure with hypoxia (HCC) ? ?Antibiotics:  ?Ceftriaxone 5/12-c ?Metronidazole 5/12-c ? ?Lines/Hardwares: 2 drain in the left mandibular area + ? ?Interval Events: continues to be afebrile, no leukocytosis. Micafungin added yesterday for tissue cx growing yeast ? 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 ?- s/p I and D + removal of #[id number]  with Oral surgery on 5/13 with tissue cx ( not bone )growing bacteroides and candida dubliniensis ?- Per OR note " The bone was examined and the cortical bone appeared intact without any loose bony sequestra or signs of advanced osteomyelitis" However, concerns for spontaneous left mandibular osteomyelitis per Oral surgery  ?- Blood cx 5/13 no growth in 3 days  ? ?2. Acute respiratory failure, resolved - extubated  ?3. Psoriatic arthritis  - on kenalog cream  ? ?Recommendations ?PICC line  ?Continue ceftriaxone + Metronidazole. Plan for 6 weeks from 5/13 ?Will switch Micafungin to PO fluconazole for 6 weeks as anticipate high susceptibility of candida dubliniensis to Fluconazole.  Of note , candida was isolated from the granulation tissue around the mandible ( not bone) and hence, do not anticipate 6-12 months of treatment usually recommended for fungal osteomyelitis However, can  decide duration during clinic follow up. Will request sensi of candida dubliniensis which might take several days.  ?Monitor CBC, CMP ?ID pharmacy to place OPAT orders ?Fu OR cultures to completion.  ?Will arrange for ID fu and sign off for now.  ? ?Rest of the management as per the primary team. ?Thank you for the consult. Please page with pertinent questions or concerns. ? ?______________________________________________________________________ ?Subjective ?patient seen and examined at the bedside.  ?Complains of pain at the left neck ?Denies nausea, vomiting and diarrhea  ? ?Vitals ?BP 139/68 (BP Location: Left Arm)   Pulse 77   Temp 98.7 ?F (37.1 ?C) (Oral)   Resp 16   Ht  (1.6 m)   Wt 71.7 kg   SpO2 99%   BMI 27.99 kg/m?  ? ?  ?Physical Exam ?Constitutional:  sitting up in the chair  ?   Comments:  ? ?Cardiovascular:  ?   Rate and Rhythm: Normal rate and regular rhythm.  ?   Heart sounds:  ? ?Pulmonary ?   Effort: Pulmonary effort is normal on room air  ?   Comments: Normal breath sounds  ? ?Abdominal:  ?   Palpations: Abdomen is soft.  ?   Tenderness: non distended and non tender  ? ?Musculoskeletal:     ?   General: No swelling or tenderness.  ? ?Skin: ?   Comments: left mandibular area with drain in place with minimal serosanguinous drainage, mild trismus+ ? ?Neurological:  ?   General: Grossly non focal, awake, alert and oriented  ? ?Psychiatric:     ?   Mood and Affect: Mood normal.  ? ?Pertinent Microbiology ?Results for orders placed or performed during the hospital encounter of 09/18/21  ?Resp Panel by RT-PCR (  Flu A&B, Covid) Nasopharyngeal Swab     Status: None  ? Collection Time: 09/18/21  6:19 PM  ? Specimen: Nasopharyngeal Swab; Nasopharyngeal(NP) swabs in vial transport medium  ?Result Value Ref Range Status  ? SARS Coronavirus 2 by RT PCR NEGATIVE NEGATIVE Final  ?  Comment: (NOTE) ?SARS-CoV-2 target nucleic acids are NOT DETECTED. ? ?The SARS-CoV-2 RNA is generally detectable in upper  respiratory ?specimens during the acute phase of infection. The lowest ?concentration of SARS-CoV-2 viral copies this assay can detect is ?138 copies/mL. A negative result does not preclude SARS-Cov-2 ?infection and should not be used as the sole basis for treatment or ?other patient management decisions. A negative result may occur with  ?improper specimen collection/handling, submission of specimen other ?than nasopharyngeal swab, presence of viral mutation(s) within the ?areas targeted by this assay, and inadequate number of viral ?copies(<138 copies/mL). A negative result must be combined with ?clinical observations, patient history, and epidemiological ?information. The expected result is Negative. ? ?Fact Sheet for Patients:  ?BloggerCourse.comhttps://www.fda.gov/media/152166/download ? ?Fact Sheet for Healthcare Providers:  ?SeriousBroker.ithttps://www.fda.gov/media/152162/download ? ?This test is no t yet approved or cleared by the [place] FDA and  ?has been authorized for detection and/or diagnosis of SARS-CoV-2 by ?FDA under an Emergency Use Authorization (EUA). This EUA will remain  ?in effect (meaning this test can be used) for the duration of the ?COVID-19 declaration under Section 564(b)(1) of the Act, 21 ?U.S.C.section 360bbb-3(b)(1), unless the authorization is terminated  ?or revoked sooner.  ? ? ?  ? Influenza A by PCR NEGATIVE NEGATIVE Final  ? Influenza B by PCR NEGATIVE NEGATIVE Final  ?  Comment: (NOTE) ?The Xpert Xpress SARS-CoV-2/FLU/RSV plus assay is intended as an aid ?in the diagnosis of influenza from Nasopharyngeal swab specimens and ?should not be used as a sole basis for treatment. Nasal washings and ?aspirates are unacceptable for Xpert Xpress SARS-CoV-2/FLU/RSV ?testing. ? ?Fact Sheet for Patients: ?BloggerCourse.comhttps://www.fda.gov/media/152166/download ? ?Fact Sheet for Healthcare Providers: ?SeriousBroker.ithttps://www.fda.gov/media/152162/download ? ?This test is not yet approved or cleared by the [place] FDA and ?has been  authorized for detection and/or diagnosis of SARS-CoV-2 by ?FDA under an Emergency Use Authorization (EUA). This EUA will remain ?in effect (meaning this test can be used) for the duration of the ?COVID-19 declaration under Section 564(b)(1) of the Act, 21 U.S.C. ?section 360bbb-3(b)(1), unless the authorization is terminated or ?revoked. ? ?Performed at [facility], [address] [place], [place] [address] ?  ?Aerobic/Anaerobic Culture w Gram Stain (surgical/deep wound)     Status: None  ? Collection Time: 09/19/21  9:13 AM  ? Specimen: PATH Other; Body Fluid  ?Result Value Ref Range Status  ? Specimen Description WOUND  Final  ? Special Requests LEFT MASTICATOR SPACE SPEC A  Final  ? Gram Stain   Final  ?  MODERATE WBC PRESENT, PREDOMINANTLY PMN ?FEW GRAM NEGATIVE RODS ?  ? Culture   Final  ?  MODERATE PREVOTELLA ORIS ?BETA LACTAMASE POSITIVE ?Performed at [facility], [address] [place], [place] [address] ?  ? Report Status 09/22/2021 FINAL  Final  ?Aerobic/Anaerobic Culture w Gram Stain (surgical/deep wound)     Status: None (Preliminary result)  ? Collection Time: 09/19/21  9:35 AM  ? Specimen: PATH Soft tissue  ?Result Value Ref Range Status  ? Specimen Description TISSUE  Final  ? Special Requests LEFT MANDIBLE GRADNULATION TISSUE SPEC B  Final  ? Gram Stain   Final  ?  NO WBC SEEN ?NO ORGANISMS  SEEN ?Performed at [facility], [address] [place], [place] [address] ?  ? Culture   Final  ?  RARE CANDIDA DUBLINIENSIS ?CRITICAL RESULT CALLED TO, READ BACK BY AND VERIFIED WITH: RN B.BETNER AT 1006 ON 09/21/2021 BY T.SAAD. ?NO ANAEROBES ISOLATED; CULTURE IN PROGRESS FOR 5 DAYS ?  ? Report Status PENDING  Incomplete  ?MRSA Next Gen by PCR, Nasal     Status: None  ? Collection Time: 09/19/21 12:46 PM  ? Specimen: Nasal Mucosa; Nasal Swab  ?Result Value Ref Range Status  ? MRSA by PCR Next Gen NOT DETECTED NOT DETECTED Final  ?  Comment: (NOTE) ?The GeneXpert MRSA Assay (FDA approved for  NASAL specimens only), ?is one component of a comprehensive MRSA colonization surveillance ?program. It is not intended to diagnose MRSA infection nor to guide ?or monitor treatment for MRSA infections. ?Test performance

## 2021-09-22 NOTE — Anesthesia Postprocedure Evaluation (Signed)
Anesthesia Post Note ? ?Patient: Johnathan Hausenhyllis Barwick ? ?Procedure(s) Performed: INCISION AND DRAINAGE ABSCESS WITH EXTRACTION OF TEETH [phone],25,26 (Face) ? ?  ? ?Patient location during evaluation: Nursing Unit ?Anesthesia Type: General ?Level of consciousness: awake and alert, patient cooperative and oriented ?Pain management: pain level controlled ?Vital Signs Assessment: post-procedure vital signs reviewed and stable ?Respiratory status: spontaneous breathing, nonlabored ventilation and respiratory function stable ?Cardiovascular status: blood pressure returned to baseline and stable ?Postop Assessment: no apparent nausea or vomiting, able to ambulate and adequate PO intake ?Anesthetic complications: no ?Comments: Pt doing very well ? ? ?No notable events documented. ? ?Last Vitals:  ?Vitals:  ? 09/22/21 1112 09/22/21 1629  ?BP: 126/78 139/68  ?Pulse: 88 77  ?Resp: 16 16  ?Temp: 36.9 ?C 37.1 ?C  ?SpO2: 98% 99%  ?  ?Last Pain:  ?Vitals:  ? 09/22/21 1629  ?TempSrc: Oral  ?PainSc:   ? ? ?  ?  ?  ?  ?  ?  ? ?,E.  ? ? ? ? ?

## 2021-09-22 NOTE — Progress Notes (Signed)
?PROGRESS NOTE ? ? ? ?Carolyn Black  ZOX:[id number] DOB: 09/30/1960 DOA: 09/18/2021 ?PCP: [facility]  ? ? ?Brief Narrative:  ? ?Carolyn Black is a 61 y.o. female with past medical history significant for COPD, rheumatoid arthritis, psoriasis, tobacco use disorder who [facility] with worsening facial swelling in the setting of left dental abscess.  Patient was seen by her dentist roughly 1 week prior and was placed on clindamycin.  Followed up with dental on 5/9 and antibiotics changed to Augmentin; however symptoms continued to progress.  Now patient with chills, subjective fevers, worsening pain, trismus and facial edema with poor oral intake.  In the ED, CT showing fluid collections, cellulitis with left submandibular, sublingual and masticator space involvement with minimal airway deviation.  Oral maxillofacial surgery, Dr. Ross MarcusSherwood was consulted and initially admitted to the hospital service; taken to the OR for a I&D.  Postoperatively patient remained intubated for airway protection and [facility].  Patient was successfully extubated on 09/20/2021 and transferred back to the hospitalist service on 09/22/2021. ? ?Assessment & Plan: ?  ?Left sublingual, submandibular murmur, masticator oral pharyngeal abscess ?Presenting to ED with progressive facial swelling, pain, chills and subjective fevers and poor oral intake.  Was being managed by outpatient dental medicine with 2 rounds of antibiotics with clindamycin followed by Augmentin without much effect.  CT on admission notable for fluid collections, cellulitis with left submandibular, sublingual and masticator space involvement with minimal airway deviation; also some concern for osteomyelitis. OMFS, Dr. Ross MarcusSherwood was consulted and patient underwent I&D of abscesses on 09/19/2021. ?--OMFS and ID following; appreciate assistance ?--Blood cultures x2 with no growth x3 days  ?--left masticator space  culture with moderate Prevotella oris ?--Left mandible granulation tissue specimen: Rare yeast ?--Fungus culture with stain: Pending ?--Ceftriaxone 2 g IV every 24 hours ?--Metronidazole 500 mg IV every 12 hours ?--Micafungin 100 mg IV every 24 hours ?--Pain control with Tylenol, oxycodone, as needed ?--Further per ID/OMFS ? ?COPD ?Home regimen includes Advair discus 1 puff twice daily and albuterol MDI as needed. ?--Brovana neb twice daily ?--Pulmicort neb twice daily ?--DuoNebs 3 times daily ?--Albuterol neb every 4 hours as needed wheezing/shortness of breath ? ?Hypomagnesemia ?Magnesium 1.6 this morning, will replete. ?--Repeat magnesium level in a.m. ? ?Hypokalemia ?Repleted, potassium 4.1 this morning. ?--Repeat BMP in a.m. ? ?Psoriasis ?--Kenalog cream twice daily ? ?Anxiety ?--Xanax 0.25 mg p.o. twice daily as needed anxiety ? ?Tobacco use disorder ?Counseled on need for complete cessation. ?--Nicotine patch ? ? ?DVT prophylaxis: enoxaparin (LOVENOX) injection 40 mg Start: 09/18/21 1815 ?SCDs Start: 09/18/21 1801 ? ?  Code Status: Full Code ?Family Communication: No family present at bedside this morning ? ?Disposition Plan:  ?Level of care: Med-Surg ?Status is: Inpatient ?Remains inpatient appropriate because: IV antibiotics, awaiting further recommendations per oral maxillofacial surgery and infectious disease ?  ? ?Consultants:  ?Oral maxillofacial surgery, Dr. Ross MarcusSherwood ?Infectious disease ? ?Procedures:  ?I&D facial abscesses 5/13, Dr. Ross MarcusSherwood ? ?Antimicrobials:  ?Ceftriaxone 5/12>> ?Metronidazole 5/12>> ?Micafungin 5/15>> ? ? ?Subjective: ?Patient seen examined bedside, resting comfortably.  Sitting in bedside chair.  Complaining about facial pain.  Only has had "Tylenol".  Discussed with patient that she also has oxycodone available if needed.  Penrose drains remain in place.  Currently on full liquid diet, does not wish to advance any further this morning.  Awaiting further ID and O MFS  recommendations.  Denies headache, no chest pain, no palpitations,  no shortness of breath, no abdominal pain, no focal weakness, no fatigue, no fever/chills/night sweats, no nausea/vomiting/diarrhea, no paresthesias.  No acute events overnight per nurse staff. ? ?Objective: ?Vitals:  ? 09/22/21 0448 09/22/21 4098 09/22/21 1191 09/22/21 4782  ?BP: (!) 149/67 138/87    ?Pulse: 82 84    ?Resp: 16 16    ?Temp: 97.6 ?F (36.4 ?C) 98.3 ?F (36.8 ?C)    ?TempSrc: Oral Oral    ?SpO2: 100% 99% 98% 98%  ?Weight:      ?Height:      ? ? ?Intake/Output Summary (Last 24 hours) at 09/22/2021 1032 ?Last data filed at 09/22/2021 9562 ?Gross per 24 hour  ?Intake 1155.92 ml  ?Output 600 ml  ?Net 555.92 ml  ? ?Filed Weights  ? 09/18/21 1145 09/19/21 0656  ?Weight: 71.7 kg 71.7 kg  ? ? ?Examination: ? ?Physical Exam: ?GEN: NAD, alert and oriented x 3, chronically ill in appearance ?HEENT: NCAT, PERRL, EOMI, sclera clear, MMM, left-sided facial swelling noted with Penrose drains in place ?PULM: CTAB w/o wheezes/crackles, normal respiratory effort, on room air ?CV: RRR w/o M/G/R ?GI: abd soft, NTND, NABS, no R/G/M ?MSK: no peripheral edema, muscle strength globally intact 5/5 bilateral upper/lower extremities ?NEURO: CN II-XII intact, no focal deficits, sensation to light touch intact ?PSYCH: normal mood/affect ?Integumentary: Facial edema/surgical incision sites as above with Penrose drains in place dry/intact, no rashes or wounds ? ? ? ?Data Reviewed: I have personally reviewed following labs and imaging studies ? ?CBC: ?Recent Labs  ?Lab 09/18/21 ?1505 09/19/21 ?0416 09/19/21 ?1308 09/19/21 ?1159 09/20/21 ?6578 09/22/21 ?0110  ?WBC 7.1 7.2  --   --  7.8 5.2  ?NEUTROABS 5.0 2.9  --   --   --  3.5  ?HGB 11.4* 14.5 11.6* 9.9* 8.9* 9.9*  ?HCT 33.6* 42.0 34.0* 29.0* 26.8* 30.1*  ?MCV 89.1 85.0  --   --  90.8 91.8  ?PLT 229 250  --   --  278 297  ? ?Basic Metabolic Panel: ?Recent Labs  ?Lab 09/18/21 ?1505 09/19/21 ?0416 09/19/21 ?1105  09/19/21 ?1159 09/19/21 ?1903 09/20/21 ?4696 09/21/21 ?0121 09/22/21 ?0110  ?NA 132*   < > THIS TEST WAS ORDERED IN ERROR AND HAS BEEN CREDITED.  132* 133* 135 135 138 134*  ?K 2.3*   < > THIS TEST WAS ORDERED IN ERROR AND HAS BEEN CREDITED.  3.0* 2.9* 3.6 3.4* 3.3* 4.1  ?CL 91*   < > THIS TEST WAS ORDERED IN ERROR AND HAS BEEN CREDITED.  94*  --  95* 101 102 103  ?CO2 30   < > THIS TEST WAS ORDERED IN ERROR AND HAS BEEN CREDITED.  28  --  ?GLUCOSE 134*   < > THIS TEST WAS ORDERED IN ERROR AND HAS BEEN CREDITED.  153*  --  183* 161* 84 93  ?BUN 18   < > THIS TEST WAS ORDERED IN ERROR AND HAS BEEN CREDITED.  8  --  ?CREATININE 0.74   < > THIS TEST WAS ORDERED IN ERROR AND HAS BEEN CREDITED.  0.70  --  0.81 0.68 0.70 0.62  ?CALCIUM 8.3*   < > THIS TEST WAS ORDERED IN ERROR AND HAS BEEN CREDITED.  8.3*  --  8.7* 7.6* 8.3* 8.2*  ?MG 1.4*  1.5*  --  1.5*  --   --  2.1 1.8 1.6*  ?PHOS  --   --   --   --   --  4.6  --  3.5  ? < > = values in this interval not displayed.  ? ?GFR: ?Estimated Creatinine Clearance: 71 mL/min (by C-G formula based on SCr of 0.62 mg/dL). ?Liver Function Tests: ?Recent Labs  ?Lab 09/18/21 ?1505 09/19/21 ?0416 09/19/21 ?1105 09/19/21 ?1903 09/20/21 ?4132  ?AST 12* 11* THIS TEST WAS ORDERED IN ERROR AND HAS BEEN CREDITED.  14* 17  --   ?ALT 15 13 THIS TEST WAS ORDERED IN ERROR AND HAS BEEN CREDITED.  10 15  --   ?ALKPHOS 97 88 THIS TEST WAS ORDERED IN ERROR AND HAS BEEN CREDITED.  77 84  --   ?BILITOT 0.7 0.4 THIS TEST WAS ORDERED IN ERROR AND HAS BEEN CREDITED.  0.5 0.2*  --   ?PROT 6.5 6.3* THIS TEST WAS ORDERED IN ERROR AND HAS BEEN CREDITED.  5.8* 6.5  --   ?ALBUMIN 2.7* 2.4* THIS TEST WAS ORDERED IN ERROR AND HAS BEEN CREDITED.  2.3* 2.5* 1.8*  ? ?No results for input(s): LIPASE, AMYLASE in the last 168 hours. ?No results for input(s): AMMONIA in the last 168 hours. ?Coagulation Profile: ?No results for input(s): INR, PROTIME in the last 168  hours. ?Cardiac Enzymes: ?No results for input(s): CKTOTAL, CKMB, CKMBINDEX, TROPONINI in the last 168 hours. ?BNP (last 3 results) ?No results for input(s): PROBNP in the last 8760 hours. ?HbA1C: ?No results for input(s): HGBA1C in the last 72 hours. ?

## 2021-09-22 NOTE — Progress Notes (Signed)
Peripherally Inserted Central Catheter Placement ? ?The IV Nurse has discussed with the patient and/or persons authorized to consent for the patient, the purpose of this procedure and the potential benefits and risks involved with this procedure.  The benefits include less needle sticks, lab draws from the catheter, and the patient may be discharged home with the catheter. Risks include, but not limited to, infection, bleeding, blood clot (thrombus formation), and puncture of an artery; nerve damage and irregular heartbeat and possibility to perform a PICC exchange if needed/ordered by physician.  Alternatives to this procedure were also discussed.  Bard Power PICC patient education guide, fact sheet on infection prevention and patient information card has been provided to patient /or left at bedside.  PICC placed by Aldona Lento, RN ? ?PICC Placement Documentation  ?PICC Single Lumen 09/22/21 Right Brachial 38 cm 0 cm (Active)  ?Indication for Insertion or Continuance of Line Home intravenous therapies (PICC only) 09/22/21 2146  ?Exposed Catheter (cm) 0 cm 09/22/21 2146  ?Site Assessment Clean;Dry;Intact 09/22/21 2146  ?Line Status Flushed;Saline locked;Blood return noted 09/22/21 2146  ?Dressing Type Transparent;Securing device 09/22/21 2146  ?Dressing Status Antimicrobial disc in place;Clean, Dry, Intact 09/22/21 2146  ?Safety Lock Not Applicable [id number]  ?Line Care Connections checked and tightened 09/22/21 2146  ?Line Adjustment (NICU/IV Team Only) No 09/22/21 2146  ?Dressing Intervention New dressing 09/22/21 2146  ?Dressing Change Due 09/29/21 09/22/21 2146  ? ? ? ? ? ?, Nicolette Bang ?09/22/2021, 9:48 PM ? ?

## 2021-09-23 DIAGNOSIS — K047 Periapical abscess without sinus: Secondary | ICD-10-CM | POA: Diagnosis not present

## 2021-09-23 DIAGNOSIS — J449 Chronic obstructive pulmonary disease, unspecified: Secondary | ICD-10-CM | POA: Diagnosis not present

## 2021-09-23 DIAGNOSIS — E876 Hypokalemia: Secondary | ICD-10-CM | POA: Diagnosis not present

## 2021-09-23 DIAGNOSIS — E871 Hypo-osmolality and hyponatremia: Secondary | ICD-10-CM | POA: Diagnosis not present

## 2021-09-23 LAB — BASIC METABOLIC PANEL
Anion gap: 7 (ref 5–15)
BUN: 11 mg/dL (ref 6–20)
CO2: 27 mmol/L (ref 22–32)
Calcium: 8.4 mg/dL — ABNORMAL LOW (ref 8.9–10.3)
Chloride: 101 mmol/L (ref 98–111)
Creatinine, Ser: 0.68 mg/dL (ref 0.44–1.00)
GFR, Estimated: >60 mL/min (ref >60)
Glucose, Bld: 92 mg/dL (ref 70–99)
Potassium: 3.9 mmol/L (ref 3.5–5.1)
Sodium: 135 mmol/L (ref 135–145)

## 2021-09-23 LAB — GLUCOSE, CAPILLARY
Glucose-Capillary: 105 mg/dL — ABNORMAL HIGH (ref 70–99)
Glucose-Capillary: 116 mg/dL — ABNORMAL HIGH (ref 70–99)
Glucose-Capillary: 84 mg/dL (ref 70–99)
Glucose-Capillary: 94 mg/dL (ref 70–99)

## 2021-09-23 LAB — FUNGAL STAIN REFLEX

## 2021-09-23 LAB — CBC
HCT: 28.6 % — ABNORMAL LOW (ref 36.0–46.0)
Hemoglobin: 9.3 g/dL — ABNORMAL LOW (ref 12.0–15.0)
MCH: 29.7 pg (ref 26.0–34.0)
MCHC: 32.5 g/dL (ref 30.0–36.0)
MCV: 91.4 fL (ref 80.0–100.0)
Platelets: 283 10*3/uL (ref 150–400)
RBC: 3.13 MIL/uL — ABNORMAL LOW (ref 3.87–5.11)
RDW: 12.9 % (ref 11.5–15.5)
WBC: 6.1 10*3/uL (ref 4.0–10.5)
nRBC: 0 % (ref 0.0–0.2)

## 2021-09-23 LAB — MAGNESIUM: Magnesium: 1.7 mg/dL (ref 1.7–2.4)

## 2021-09-23 LAB — FUNGUS STAIN

## 2021-09-23 MED ORDER — TRIAMCINOLONE ACETONIDE 0.025 % EX CREA
TOPICAL_CREAM | Freq: Two times a day (BID) | CUTANEOUS | 0 refills | Status: AC | PRN
Start: 2021-09-23 — End: ?

## 2021-09-23 MED ORDER — METRONIDAZOLE 500 MG PO TABS
500.0000 mg | ORAL_TABLET | Freq: Two times a day (BID) | ORAL | Status: DC
  Administered 2021-09-23: 500 mg via ORAL
  Filled 2021-09-23: qty 1

## 2021-09-23 MED ORDER — FLUCONAZOLE 200 MG PO TABS: 400.0000 mg | ORAL_TABLET | Freq: Every day | ORAL | 0 refills | Status: AC

## 2021-09-23 MED ORDER — METRONIDAZOLE 500 MG PO TABS: 500.0000 mg | ORAL_TABLET | Freq: Two times a day (BID) | ORAL | 0 refills | Status: AC

## 2021-09-23 MED ORDER — HEPARIN SOD (PORK) LOCK FLUSH 100 UNIT/ML IV SOLN
250.0000 [IU] | INTRAVENOUS | Status: AC | PRN
  Administered 2021-09-23: 250 [IU]

## 2021-09-23 MED ORDER — ENSURE ENLIVE PO LIQD: 237.0000 mL | Freq: Three times a day (TID) | ORAL | 1 refills | Status: AC

## 2021-09-23 MED ORDER — NICOTINE 21 MG/24HR TD PT24: 21.0000 mg | MEDICATED_PATCH | Freq: Every day | TRANSDERMAL | 0 refills | Status: AC

## 2021-09-23 MED ORDER — CEFTRIAXONE IV (FOR PTA / DISCHARGE USE ONLY): 2.0000 g | INTRAVENOUS | 0 refills | Status: AC

## 2021-09-23 MED ORDER — WHITE PETROLATUM EX OINT: 1.0000 "application " | TOPICAL_OINTMENT | CUTANEOUS | 0 refills | Status: AC | PRN

## 2021-09-23 MED ORDER — CHLORHEXIDINE GLUCONATE 0.12% ORAL RINSE (MEDLINE KIT): 15.0000 mL | Freq: Two times a day (BID) | OROMUCOSAL | 0 refills | Status: AC

## 2021-09-23 NOTE — Progress Notes (Signed)
PHARMACY CONSULT NOTE FOR: ? ?OUTPATIENT  PARENTERAL ANTIBIOTIC THERAPY (OPAT) ? ?Indication: mandibular osteomyelitis ?Regimen: Ceftriaxone 2g IV q 24h, Metronidazole 500 mg PO BID, Fluconazole 400 mg PO daily ?End date: 10/31/21 ? ?IV antibiotic discharge orders are pended. ?To discharging provider:  please sign these orders via discharge navigator,  ?Select New Orders & click on the button choice - Manage This Unsigned Work.  ?  ?Thank you for involving pharmacy in this patient's care. ? ?Elita Quick, PharmD ?PGY1 Ambulatory Care Pharmacy Resident ?09/23/2021 11:34 AM ? ?**Pharmacist phone directory can be found on [email or web address] listed under [place]** ? ? ?

## 2021-09-23 NOTE — TOC Progression Note (Signed)
Transition of Care (TOC) - Progression Note  ? ? ?Patient Details  ?Name: Carolyn Black  ?MRN: [id number] ?Date of Birth: 02/17/1961 ? ?Transition of Care (TOC) CM/SW Contact  ?Kingsley PlanWile,  Marie, RN ?Phone Number: ?09/23/2021, 10:09 AM ? ?Clinical Narrative:    ?Anticipated discharge today.  Spoke to [employer] with [facility] and [name] with [facility] , they are aware.  ? ?Spoke to patient and Carolyn Quickam and patient's daughter Carolyn Black on speaker phone . All aware discharge is today. Carolyn Black and her brother will be at hospital today at 2 [facility]. Patient will be staying with her son at discharge. Will need street number. Patient's daughter in law is a Engineer, civil (consulting)nurse. ? ?Expected Discharge Plan: Home w Home Health Services ?Barriers to Discharge: Continued Medical Work up ? ?Expected Discharge Plan and Services ?Expected Discharge Plan: Home w Home Health Services ?  ?Discharge Planning Services: CM Consult ?Post Acute Care Choice: Home Health ?Living arrangements for the past 2 months: Single Family Home ?                ?DME Arranged: N/A ?  ?  ?  ?  ?HH Arranged: RN ?HH Agency: [facility] ?Date HH Agency Contacted: 09/22/21 ?Time HH Agency Contacted: 1236 ?Representative spoke with at [facility]: Marylene LandAngela ? ? ?Social Determinants of Health (SDOH) Interventions ?  ? ?Readmission Risk Interventions ?   ? View : No data to display.  ?  ?  ?  ? ? ?

## 2021-09-23 NOTE — Progress Notes (Signed)
PT Cancellation Note ? ?Patient Details ?Name: Carolyn Black  ?MRN: [id number] ?DOB: 02/24/1961 ? ? ?Cancelled Treatment:    Reason Eval/Treat Not Completed: Other (comment).  Pt refusing visit, impending dc per her report and awaiting staff to instruct her on PICC line. ? ? ?Ivar Drapeuth E  ?09/23/2021, 2:05 PM ? ?Samul Dadauth , PT PhD ?Acute Rehab [facility]. Number: [facility] [phone] and [facility] [phone] ? ?

## 2021-09-23 NOTE — Progress Notes (Signed)
Progress Note ?  ?  ?ID:  66 yoF patient with a left submandibular, sublingual, and masticator space infection s/p I&D of infection with extraction of teeth #23-26 on 09/19/21.  ?  ?Interval History ?5/14- POD 1. NAEON. Remains intubated with sedation weaned, on fent gtt for pain control. ICU reports cuff leak and patient indicating strong want to have ETT removed and desire to eat. Overall reports feeling a little bit better.Postop CT shows appropriate drain placement and drainage of fluid collections. ?5/15 - POD 2 - Patient reports overall continuing to feel better. The swelling around her mandible feels much better, still with some swelling in her left temple. She has been able to walk around and swallow without difficulty.  ?5/16 - POD 3 - Patient continues to endorse slow improvement. Swelling around temple is relatively unchanged and can expressed mixed SS/purulent discharge. No drainage from submandibular drain. ?5/17 POD 4 - Patient remains clinically stable/making slow and steady progress. Submandibular drain removed yesterday. Still has fullness and mixed drainage from temporal area.  ? ?  ?Clinical Exam: ?             Drains intact; able to express SS fluid around temporal drains with palpation ?  Extraoral swelling is similar to previous exam ?       MIO is 35-40 mm ?            The floor of mouth is slightly raised on the left side, improving and less erythematous ?            The tongue is not raised ?            Oropharynx is clear ?            Intraorally sutures are intact and no purulence can be appreciated ?  ?  ?Assessment: 81 yoF patient with a left submandibular, sublingual, and masticator space infection s/p I&D of infection with extraction of teeth #23-26 on 09/19/21. Overall patient is showing signs of clinical improvement. ?  ?  ?Plan: ?-Appreciate medicine's management of patient's systemic health ?  ?OMFS Recommendations ?Molli Knock for discharge today from OMFS perspective ? --Will leave  temporal drains intact and plan for patient to follow up in my clinic [facility] in [place] [phone]) on Friday 5/19 for drain removal ? --Appreciate medicine's recommendation for antibiotics/antifungal recommendation for discharge; will plan to follow up with patient and monitor outpatient for any signs of progressing osteomyelitis ?-Okay for soft mechanical diet ?-No showering until drains are removed ?-Peridex (chlorhexidine) mouthrnise QID ?-Follow cultures ?  ?  ?  ?Herbie Saxon, DDS ?Oral and Maxillofacial Surgeon ? Oral [facility]) ?Office # [phone] ?Cell # [phone]  ?

## 2021-09-23 NOTE — Progress Notes (Addendum)
Discharge instructions given with family present at bedside. PICC line teaching completed. Daily IV ABX administered this afternoon per MAR. All printed prescriptions given to pt daughter.  ?Pt transported off unit via WC with all belongings and family at side. She is A&Ox4 and stable at baseline. RUE SL PICC remains clean dry and intact. ?

## 2021-09-23 NOTE — Discharge Summary (Incomplete)
?Physician Discharge Summary ?  ?Patient: Carolyn Black MRN: [id number] DOB: March 02, 1961  ?Admit date:     09/18/2021  ?Discharge date: 09/23/21  ?Discharge Physician: Hosie Poisson  ? ?PCP: [facility]  ? ?Recommendations at discharge:  ?Please follow up with PCP in one week.  ?Please follow up with ID and Dr Conley Simmonds as recommended.  ? ?Discharge Diagnoses: ?Principal Problem: ?  Dental abscess ?Active Problems: ?  COPD (chronic obstructive pulmonary disease) ([place]) ?  Tobacco abuse counseling ?  Psoriatic arthritis ([place]) ?  Hypokalemia ?  Supraglottic airway obstruction ?  Acute respiratory failure with hypoxia ([place]) ? ?Resolved Problems: ?  Abscess ? ?Hospital Course: ?No notes on file ? ?Assessment and Plan: ?* Dental abscess ?Noted progressively worsening left-sided dental abscess status post course of clindamycin and Augmentin over the past 7 days. ?CT soft tissue showing Widespread odontogenic infection involving the left submandibular and left masticator spaces with a large abscess and myositis extending from the masticator space into the scalp ?Dr. Terese Door with oral surgery/ENT coverage aware of case ?Planning for surgical I&D in the morning ?Started on IV Rocephin and Flagyl per recommendations in the ER ?We will continue IV antibiotics ?Pain control ?Follow closely ? ? ?Hypokalemia ?K 2.3 on presentation  ?Suspect secondary to decreased po intake  ?Replete  ?Also replete Mg ?Follow  ? ? ?Psoriatic arthritis ([place]) ?Appears to be at baseline  ?Follow  ? ? ?Tobacco abuse counseling ?Baseline 1 pack/day smoker ?Discussed smoking cessation at length ?Suspect this is likely confounding issue in the setting of dental abscess ?Patient expressed understanding ?We will place nicotine patch ?Follow ? ?COPD (chronic obstructive pulmonary disease) ([place]) ?Stable from a respiratory standpoint at present ?Continue home regimen ?Follow ? ? ? ? ? {Tip this will not be part of the note when signed Body mass  index is 27.99 kg/m?. ?,  ?Nutrition Documentation   ? ?Flowsheet Row ED to Hosp-Admission (Current) from 09/18/2021 in [place]  ?Nutrition Problem Inadequate oral intake  ?Etiology mouth pain  ?Nutrition Goal Patient will meet greater than or equal to 90% of their needs  ?Interventions Ensure Enlive (each supplement provides 350kcal and 20 grams of protein)  ? ?  ?,  (Optional):26781} ? ?{(NOTE) Pain control PDMP Statment (Optional):26782} ?Consultants: *** ?Procedures performed: ***  ?Disposition: {Plan; Disposition:26390} ?Diet recommendation:  ?Discharge Diet Orders (From admission, onward)  ? ?  Start     Ordered  ? 09/23/21 0000  Diet - low sodium heart healthy       ? 09/23/21 1201  ? ?  ?  ? ?  ? ?{Diet_Plan:26776} ?DISCHARGE MEDICATION: ?Allergies as of 09/23/2021   ?No Known Allergies ?  ? ?  ?Medication List  ?  ? ?STOP taking these medications   ? ?amoxicillin-clavulanate 875-125 MG tablet ?Commonly known as: AUGMENTIN ?  ?clindamycin 300 MG capsule ?Commonly known as: CLEOCIN ?  ?traMADol 50 MG tablet ?Commonly known as: ULTRAM ?  ? ?  ? ?TAKE these medications   ? ?Advair Diskus 250-50 MCG/ACT Aepb ?Generic drug: fluticasone-salmeterol ?Inhale 1 puff into the lungs 2 (two) times daily. ?  ?albuterol 108 (90 Base) MCG/ACT inhaler ?Commonly known as: VENTOLIN HFA ?Inhale into the lungs. ?  ?ALPRAZolam 0.25 MG tablet ?Commonly known as: Duanne Moron ?Take 0.25 mg by mouth 2 (two) times daily as needed for anxiety. ?  ?cefTRIAXone  IVPB ?Commonly known as: ROCEPHIN ?Inject 2 g into the vein daily. Indication:  Mandibular osteomyelitis ?First Dose: Yes ?Last Day of Therapy:  10/31/2021 ?Labs - Once weekly:  CBC/D and BMP, ?Labs - Every other week:  ESR and CRP ?Method of administration: IV Push ?Method of administration may be changed at the discretion of home infusion pharmacist based upon assessment of the patient and/or caregiver's ability to self-administer the medication  ordered. ?  ?cetirizine 10 MG tablet ?Commonly known as: ZYRTEC ?Take 10 mg by mouth daily. ?  ?chlorhexidine gluconate (MEDLINE KIT) 0.12 % solution ?Commonly known as: PERIDEX ?15 mLs by Mouth Rinse route 2 (two) times daily. ?  ?diclofenac 75 MG EC tablet ?Commonly known as: VOLTAREN ?Take 75 mg by mouth 2 (two) times daily. ?  ?diphenhydrAMINE 25 MG tablet ?Commonly known as: BENADRYL ?Take 25 mg by mouth at bedtime as needed for sleep. ?  ?feeding supplement Liqd ?Take 237 mLs by mouth 3 (three) times daily between meals. ?  ?fluconazole 200 MG tablet ?Commonly known as: DIFLUCAN ?Take 2 tablets (400 mg total) by mouth daily. ?Start taking on: Sep 24, 2021 ?  ?lidocaine 5 % ointment ?Commonly known as: XYLOCAINE ?Apply topically. ?  ?metroNIDAZOLE 500 MG tablet ?Commonly known as: FLAGYL ?Take 1 tablet (500 mg total) by mouth every 12 (twelve) hours. ?  ?nicotine 21 mg/24hr patch ?Commonly known as: NICODERM CQ - dosed in mg/24 hours ?Place 1 patch (21 mg total) onto the skin daily. ?Start taking on: Sep 24, 2021 ?  ?oxyCODONE 5 MG immediate release tablet ?Commonly known as: Oxy IR/ROXICODONE ?Take 5 mg by mouth See admin instructions. 2 capsules by mouth every 4 to 6 hours as needed for pain ?  ?triamcinolone 0.025 % cream ?Commonly known as: KENALOG ?Apply topically 2 (two) times daily as needed (psoriasis). ?  ?VITAMIN D3 PO ?Take 50,000 Units by mouth See admin instructions. 1 tablet weekly on Saturdays ?  ?white petrolatum Oint ?Commonly known as: VASELINE ?Apply 1 application. topically as needed for dry skin. ?  ? ?  ? ?  ?  ? ? ?  ?Discharge Care Instructions  ?(From admission, onward)  ?  ? ? ?  ? ?  Start     Ordered  ? 09/23/21 0000  Change dressing on IV access line weekly and PRN  (Home infusion instructions - [facility] )       ? 09/23/21 1156  ? ?  ?  ? ?  ? ? Follow-up Information   ? ? [place] [place] Follow up.   ?Specialty: Home Health Services ?Why:  [facility] ?Contact information: ?[address] ?[address] ?[place] [address] ?[phone] ? ? ?  ?  ? ? [facility] Follow up.   ?Why: [phone] ? ?  ?  ? ?  ?  ? ?  ? ?Discharge Exam: ?Filed Weights  ? 09/18/21 1145 09/19/21 0656  ?Weight: 71.7 kg 71.7 kg  ? ?*** ? ?Condition at discharge: {DC Condition:26389} ? ?The results of significant diagnostics from this hospitalization (including imaging, microbiology, ancillary and laboratory) are listed below for reference.  ? ?Imaging Studies: ?CT Soft Tissue Neck W Contrast ? ?Addendum Date: 09/18/2021   ?ADDENDUM REPORT: 09/18/2021 17:26 ADDENDUM: Mild soft tissue thickening involving the left aspect of the epiglottis, tongue base, and lateral oropharynx. This may be inflammatory, however direct visualization is recommended to exclude a mass. Electronically Signed   By: Logan Bores M.D.   On: 09/18/2021 17:26  ? ?Result Date: 09/18/2021 ?CLINICAL DATA:  Left-sided facial  soft tissue swelling. Infection suspected. EXAM: CT NECK WITH CONTRAST TECHNIQUE: Multidetector CT imaging of the neck was performed using the standard protocol following the bolus administration of intravenous contrast. RADIATION DOSE REDUCTION: This exam was performed according to the departmental dose-optimization program which includes automated exposure control, adjustment of the mA and/or kV according to patient size and/or use of iterative reconstruction technique. CONTRAST:  2m OMNIPAQUE IOHEXOL 300 MG/ML  SOLN COMPARISON:  None Available. FINDINGS: Pharynx and larynx: Asymmetric, mild thickening involving the left aspect of the epiglottis, left tongue base, and left lateral oropharyngeal wall. No retropharyngeal fluid collection. Salivary glands: Marked asymmetric atrophy of the right submandibular gland. Mass effect on and possibly secondary inflammation of the left parotid gland by the process described below. Thyroid: Unremarkable. Lymph nodes: Mild asymmetric enlargement of left  level I and II lymph nodes measuring up to 1 cm in short axis, likely reactive. Vascular: Major vascular structures of the neck are grossly patent. Mild atherosclerotic plaque at the carotid bifurcations. Limited intr

## 2021-09-24 DIAGNOSIS — J9601 Acute respiratory failure with hypoxia: Secondary | ICD-10-CM | POA: Diagnosis not present

## 2021-09-24 DIAGNOSIS — J386 Stenosis of larynx: Secondary | ICD-10-CM | POA: Diagnosis not present

## 2021-09-24 DIAGNOSIS — M069 Rheumatoid arthritis, unspecified: Secondary | ICD-10-CM | POA: Diagnosis not present

## 2021-09-24 DIAGNOSIS — M272 Inflammatory conditions of jaws: Secondary | ICD-10-CM | POA: Diagnosis not present

## 2021-09-24 DIAGNOSIS — F419 Anxiety disorder, unspecified: Secondary | ICD-10-CM | POA: Diagnosis not present

## 2021-09-24 DIAGNOSIS — E876 Hypokalemia: Secondary | ICD-10-CM | POA: Diagnosis not present

## 2021-09-24 DIAGNOSIS — Z452 Encounter for adjustment and management of vascular access device: Secondary | ICD-10-CM | POA: Diagnosis not present

## 2021-09-24 DIAGNOSIS — Z5181 Encounter for therapeutic drug level monitoring: Secondary | ICD-10-CM | POA: Diagnosis not present

## 2021-09-24 DIAGNOSIS — J449 Chronic obstructive pulmonary disease, unspecified: Secondary | ICD-10-CM | POA: Diagnosis not present

## 2021-09-24 DIAGNOSIS — F17210 Nicotine dependence, cigarettes, uncomplicated: Secondary | ICD-10-CM | POA: Diagnosis not present

## 2021-09-24 DIAGNOSIS — K047 Periapical abscess without sinus: Secondary | ICD-10-CM | POA: Diagnosis not present

## 2021-09-24 DIAGNOSIS — Z792 Long term (current) use of antibiotics: Secondary | ICD-10-CM | POA: Diagnosis not present

## 2021-09-24 DIAGNOSIS — L4050 Arthropathic psoriasis, unspecified: Secondary | ICD-10-CM | POA: Diagnosis not present

## 2021-09-24 LAB — CULTURE, BLOOD (ROUTINE X 2)
Culture: NO GROWTH
Culture: NO GROWTH
Special Requests: ADEQUATE

## 2021-09-24 LAB — AEROBIC/ANAEROBIC CULTURE W GRAM STAIN (SURGICAL/DEEP WOUND): Gram Stain: NONE SEEN

## 2021-09-24 NOTE — Discharge Summary (Incomplete)
Physician Discharge Summary   Patient: Carolyn Black MRN: [id number] DOB: 1960-07-25  Admit date:     09/18/2021  Discharge date: {dischdate:26783}  Discharge Physician: Hosie Poisson   PCP: [facility]   Recommendations at discharge:  {Tip this will not be part of the note when signed- Example include specific recommendations for outpatient follow-up, pending tests to follow-up on. (Optional):26781}  ***  Discharge Diagnoses: Principal Problem:   Dental abscess Active Problems:   COPD (chronic obstructive pulmonary disease) (HCC)   Tobacco abuse counseling   Psoriatic arthritis ([place])   Hypokalemia   Supraglottic airway obstruction   Acute respiratory failure with hypoxia (HCC)  Resolved Problems:   Abscess  Hospital Course: No notes on file  Assessment and Plan: * Dental abscess Noted progressively worsening left-sided dental abscess status post course of clindamycin and Augmentin over the past 7 days. CT soft tissue showing Widespread odontogenic infection involving the left submandibular and left masticator spaces with a large abscess and myositis extending from the masticator space into the scalp Dr. Terese Door with oral surgery/ENT coverage aware of case Planning for surgical I&D in the morning Started on IV Rocephin and Flagyl per recommendations in the ER We will continue IV antibiotics Pain control Follow closely   Hypokalemia K 2.3 on presentation  Suspect secondary to decreased po intake  Replete  Also replete Mg Follow    Psoriatic arthritis ([place]) Appears to be at baseline  Follow    Tobacco abuse counseling Baseline 1 pack/day smoker Discussed smoking cessation at length Suspect this is likely confounding issue in the setting of dental abscess Patient expressed understanding We will place nicotine patch Follow  COPD (chronic obstructive pulmonary disease) ([place]) Stable from a respiratory standpoint at present Continue home  regimen Follow      {Tip this will not be part of the note when signed Body mass index is 27.99 kg/m. ,  Nutrition Documentation    [facility] ED to Hosp-Admission (Discharged) from 09/18/2021 in [facility] [place]  Nutrition Problem Inadequate oral intake  Etiology mouth pain  Nutrition Goal Patient will meet greater than or equal to 90% of their needs  Interventions Ensure Enlive (each supplement provides 350kcal and 20 grams of protein)     ,  (Optional):26781}  {(NOTE) Pain control PDMP Statment (Optional):26782} Consultants: *** Procedures performed: ***  Disposition: {Plan; Disposition:26390} Diet recommendation:  Discharge Diet Orders (From admission, onward)     Start     Ordered   09/23/21 0000  Diet - low sodium heart healthy        09/23/21 1201           {Diet_Plan:26776} DISCHARGE MEDICATION: Allergies as of 09/23/2021   No Known Allergies      Medication List     STOP taking these medications    amoxicillin-clavulanate 875-125 MG tablet Commonly known as: AUGMENTIN   clindamycin 300 MG capsule Commonly known as: CLEOCIN   traMADol 50 MG tablet Commonly known as: ULTRAM       TAKE these medications    Advair Diskus 250-50 MCG/ACT Aepb Generic drug: fluticasone-salmeterol Inhale 1 puff into the lungs 2 (two) times daily.   albuterol 108 (90 Base) MCG/ACT inhaler Commonly known as: VENTOLIN HFA Inhale into the lungs.   ALPRAZolam 0.25 MG tablet Commonly known as: XANAX Take 0.25 mg by mouth 2 (two) times daily as needed for anxiety.   cefTRIAXone  IVPB Commonly known as: ROCEPHIN Inject 2  g into the vein daily. Indication:  Mandibular osteomyelitis First Dose: Yes Last Day of Therapy:  10/31/2021 Labs - Once weekly:  CBC/D and BMP, Labs - Every other week:  ESR and CRP Method of administration: IV Push Method of administration may be changed at the discretion of home infusion pharmacist based upon  assessment of the patient and/or caregiver's ability to self-administer the medication ordered.   cetirizine 10 MG tablet Commonly known as: ZYRTEC Take 10 mg by mouth daily.   chlorhexidine gluconate (MEDLINE KIT) 0.12 % solution Commonly known as: PERIDEX 15 mLs by Mouth Rinse route 2 (two) times daily.   diclofenac 75 MG EC tablet Commonly known as: VOLTAREN Take 75 mg by mouth 2 (two) times daily.   diphenhydrAMINE 25 MG tablet Commonly known as: BENADRYL Take 25 mg by mouth at bedtime as needed for sleep.   feeding supplement Liqd Take 237 mLs by mouth 3 (three) times daily between meals.   fluconazole 200 MG tablet Commonly known as: DIFLUCAN Take 2 tablets (400 mg total) by mouth daily.   lidocaine 5 % ointment Commonly known as: XYLOCAINE Apply topically.   metroNIDAZOLE 500 MG tablet Commonly known as: FLAGYL Take 1 tablet (500 mg total) by mouth every 12 (twelve) hours.   nicotine 21 mg/24hr patch Commonly known as: NICODERM CQ - dosed in mg/24 hours Place 1 patch (21 mg total) onto the skin daily.   oxyCODONE 5 MG immediate release tablet Commonly known as: Oxy IR/ROXICODONE Take 5 mg by mouth See admin instructions. 2 capsules by mouth every 4 to 6 hours as needed for pain   triamcinolone 0.025 % cream Commonly known as: KENALOG Apply topically 2 (two) times daily as needed (psoriasis).   VITAMIN D3 PO Take 50,000 Units by mouth See admin instructions. 1 tablet weekly on Saturdays   white petrolatum Oint Commonly known as: VASELINE Apply 1 application. topically as needed for dry skin.               Discharge Care Instructions  (From admission, onward)           Start     Ordered   09/23/21 0000  Change dressing on IV access line weekly and PRN  (Home infusion instructions - [facility] )        09/23/21 1156            Follow-up Information     [place] [place] Follow up.   Specialty: Home Health  Services Why: Merchandiser, retail information: [place] [address] [phone]         [facility] Follow up.   Why: [phone]               Discharge Exam: Filed Weights   09/18/21 1145 09/19/21 0656  Weight: 71.7 kg 71.7 kg   ***  Condition at discharge: {DC Condition:26389}  The results of significant diagnostics from this hospitalization (including imaging, microbiology, ancillary and laboratory) are listed below for reference.   Imaging Studies: CT Soft Tissue Neck W Contrast  Addendum Date: 09/18/2021   ADDENDUM REPORT: 09/18/2021 17:26 ADDENDUM: Mild soft tissue thickening involving the left aspect of the epiglottis, tongue base, and lateral oropharynx. This may be inflammatory, however direct visualization is recommended to exclude a mass. Electronically Signed   By: Logan Bores M.D.   On: 09/18/2021 17:26   Result Date: 09/18/2021 CLINICAL DATA:  Left-sided facial soft tissue swelling. Infection suspected.  EXAM: CT NECK WITH CONTRAST TECHNIQUE: Multidetector CT imaging of the neck was performed using the standard protocol following the bolus administration of intravenous contrast. RADIATION DOSE REDUCTION: This exam was performed according to the departmental dose-optimization program which includes automated exposure control, adjustment of the mA and/or kV according to patient size and/or use of iterative reconstruction technique. CONTRAST:  21m OMNIPAQUE IOHEXOL 300 MG/ML  SOLN COMPARISON:  None Available. FINDINGS: Pharynx and larynx: Asymmetric, mild thickening involving the left aspect of the epiglottis, left tongue base, and left lateral oropharyngeal wall. No retropharyngeal fluid collection. Salivary glands: Marked asymmetric atrophy of the right submandibular gland. Mass effect on and possibly secondary inflammation of the left parotid gland by the process described below. Thyroid: Unremarkable. Lymph nodes: Mild asymmetric  enlargement of left level I and II lymph nodes measuring up to 1 cm in short axis, likely reactive. Vascular: Major vascular structures of the neck are grossly patent. Mild atherosclerotic plaque at the carotid bifurcations. Limited intracranial: Unremarkable. Visualized orbits: Unremarkable. Mastoids and visualized paranasal sinuses: Clear. Skeleton: Largely absent dentition. Caries and periapical lucencies involving the remaining mandibular incisors. Asymmetric left temporomandibular arthritis. Mild cervical spondylosis. Upper chest: Emphysema. Other: There is inflammation within the soft tissues surrounding the left mandible beginning in the parasymphyseal region and extending posteriorly along the body and ramus. A small fluid collection along the deep margin of the anterior left mandibular body measures up to 4 mm in short axis thickness. There is a larger, peripherally enhancing fluid collection which extends from the deep margin of the angle of the mandible on the left superiorly along the ramus with involvement of the masseter and pterygoid musculature and extension superiorly into the scalp on the left along the temporalis muscle with the superior extent of the collection not included on this study. This collection measures greater than 9 cm in craniocaudal dimension and measures approximately 4.5 cm in AP dimension at the level of the infratemporal fossa and at least 6.5 cm in AP dimension in the left frontotemporal scalp. IMPRESSION: 1. Widespread, likely odontogenic infection involving the left submandibular and left masticator spaces with a large abscess and myositis extending from the masticator space into the scalp with superior extent not included on this study. 2. Aortic Atherosclerosis (ICD10-I70.0) and Emphysema (ICD10-J43.9). Electronically Signed: By: ALogan BoresM.D. On: 09/18/2021 16:39   CT MAXILLOFACIAL W CONTRAST  Result Date: 09/19/2021 CLINICAL DATA:  Maxillary/facial abscess.  Concern for osteomyelitis. EXAM: CT MAXILLOFACIAL WITH CONTRAST TECHNIQUE: Multidetector CT imaging of the maxillofacial structures was performed with intravenous contrast. Multiplanar CT image reconstructions were also generated. RADIATION DOSE REDUCTION: This exam was performed according to the departmental dose-optimization program which includes automated exposure control, adjustment of the mA and/or kV according to patient size and/or use of iterative reconstruction technique. CONTRAST:  1033mOMNIPAQUE IOHEXOL 350 MG/ML SOLN COMPARISON:  Neck CT 09/18/2021 FINDINGS: Osseous: Edentulous, with interval extraction of the remaining mandibular incisors on the prior CT. Unchanged few small gas bubbles in the left mandibular body without bone erosion in this area. Unchanged widening of the left temporomandibular joint and erosive and sclerotic changes involving the left mandibular condyle. Orbits: Mild left periorbital soft tissue swelling. No postseptal inflammation. Sinuses: Trace mucosal thickening in the left maxillary and left sphenoid sinuses. Clear mastoid air cells and middle ear cavities. Soft tissues: Interval incision and drainage of the left submandibular and left masticator space abscesses extending into the scalp shown on yesterday's CT with drains in place. Persistent extensive  inflammation in these regions, particularly in the left masticator space with involvement of the masseter and pterygoid musculature. No sizable residual undrained fluid collection. Small volume gas in the regions of abscess drainage. Unchanged borderline to mildly enlarged left submandibular and left level II lymph nodes, likely reactive. Partially visualized endotracheal tube with small volume fluid/secretions in the pharynx. Limited assessment of the pharyngeal soft tissues. Limited intracranial: Unremarkable. IMPRESSION: 1. Interval drainage of abscesses involving the left masticator and submandibular spaces and scalp.  Persistent extensive inflammation in these regions without sizable residual undrained fluid collection. 2. Interval extraction of remaining mandibular teeth. Unchanged few small foci of gas in the left mandibular body, of uncertain etiology and without cortical destruction in this region to clearly indicate osteomyelitis. 3. Widening of the left temporomandibular joint with erosion and sclerosis of the left mandibular condyle which could reflect an inflammatory arthritis or septic arthritis. Electronically Signed   By: Logan Bores M.D.   On: 09/19/2021 17:07   DG Chest Port 1 View  Result Date: 09/19/2021 CLINICAL DATA:  Dental abscess. EXAM: PORTABLE CHEST 1 VIEW COMPARISON:  None Available. FINDINGS: An endotracheal tube terminates at the level of the clavicles, proximally 5 cm above the carina. The cardiomediastinal silhouette is within normal limits. Mild scarring is noted in the lung apices. There is slight elevation of the left hemidiaphragm with mild left basilar lung opacity most compatible with atelectasis. No sizable pleural effusion or pneumothorax is identified. No acute osseous abnormality is seen. IMPRESSION: 1. Endotracheal tube in satisfactory position. 2. Mild left basilar atelectasis. Electronically Signed   By: Logan Bores M.D.   On: 09/19/2021 12:03   [place] EKG SITE RITE  Result Date: 09/22/2021 If Site Rite image not attached, placement could not be confirmed due to current cardiac rhythm.   Microbiology: Results for orders placed or performed during the hospital encounter of 09/18/21  Resp Panel by RT-PCR (Flu A&B, Covid) Nasopharyngeal Swab     Status: None   Collection Time: 09/18/21  6:19 PM   Specimen: Nasopharyngeal Swab; Nasopharyngeal(NP) swabs in vial transport medium  Result Value Ref Range Status   SARS Coronavirus 2 by RT PCR NEGATIVE NEGATIVE Final    Comment: (NOTE) SARS-CoV-2 target nucleic acids are NOT DETECTED.  The SARS-CoV-2 RNA is generally detectable in  upper respiratory specimens during the acute phase of infection. The lowest concentration of SARS-CoV-2 viral copies this assay can detect is 138 copies/mL. A negative result does not preclude SARS-Cov-2 infection and should not be used as the sole basis for treatment or other patient management decisions. A negative result may occur with  improper specimen collection/handling, submission of specimen other than nasopharyngeal swab, presence of viral mutation(s) within the areas targeted by this assay, and inadequate number of viral copies(<138 copies/mL). A negative result must be combined with clinical observations, patient history, and epidemiological information. The expected result is Negative.  Fact Sheet for Patients:  EntrepreneurPulse.com.au  Fact Sheet for Healthcare Providers:  IncredibleEmployment.be  This test is no t yet approved or cleared by the [place] FDA and  has been authorized for detection and/or diagnosis of SARS-CoV-2 by FDA under an Emergency Use Authorization (EUA). This EUA will remain  in effect (meaning this test can be used) for the duration of the COVID-19 declaration under Section 564(b)(1) of the Act, 21 U.S.C.section 360bbb-3(b)(1), unless the authorization is terminated  or revoked sooner.       Influenza A by PCR NEGATIVE NEGATIVE Final  Influenza B by PCR NEGATIVE NEGATIVE Final    Comment: (NOTE) The Xpert Xpress SARS-CoV-2/FLU/RSV plus assay is intended as an aid in the diagnosis of influenza from Nasopharyngeal swab specimens and should not be used as a sole basis for treatment. Nasal washings and aspirates are unacceptable for Xpert Xpress SARS-CoV-2/FLU/RSV testing.  Fact Sheet for Patients: EntrepreneurPulse.com.au  Fact Sheet for Healthcare Providers: IncredibleEmployment.be  This test is not yet approved or cleared by the [place] FDA and has been  authorized for detection and/or diagnosis of SARS-CoV-2 by FDA under an Emergency Use Authorization (EUA). This EUA will remain in effect (meaning this test can be used) for the duration of the COVID-19 declaration under Section 564(b)(1) of the Act, 21 U.S.C. section 360bbb-3(b)(1), unless the authorization is terminated or revoked.  Performed at [facility], [address], [place], [place] [address]   Aerobic/Anaerobic Culture w Gram Stain (surgical/deep wound)     Status: None   Collection Time: 09/19/21  9:13 AM   Specimen: PATH Other; Body Fluid  Result Value Ref Range Status   Specimen Description WOUND  Final   Special Requests LEFT MASTICATOR SPACE SPEC A  Final   Gram Stain   Final    MODERATE WBC PRESENT, PREDOMINANTLY PMN FEW GRAM NEGATIVE RODS    Culture   Final    MODERATE PREVOTELLA ORIS BETA LACTAMASE POSITIVE Performed at [facility], [address], [place], [place] [address]    Report Status 09/22/2021 FINAL  Final  Fungus Stain     Status: None   Collection Time: 09/19/21  9:13 AM   Specimen: PATH Other; Body Fluid  Result Value Ref Range Status   FUNGUS STAIN Final report  Final    Comment: (NOTE) Performed At: [facility] [place], [place] [id number] Rush Farmer MD [id number]    Fungal Source LEFT MASTICATOR SPACE SPEC A  Final    Comment: Performed at [facility], [place] [address], [place], [place] [address]  Fungal Stain reflex     Status: None   Collection Time: 09/19/21  9:13 AM  Result Value Ref Range Status   Fungal stain result 1 Comment  Final    Comment: (NOTE) KOH/Calcofluor preparation:  no fungus observed. Performed At: [facility] [place], [place] [id number] Rush Farmer MD [id number]   Fungus Culture With Stain     Status: Abnormal (Preliminary result)   Collection Time: 09/19/21  9:35 AM   Specimen: PATH Soft tissue  Result Value Ref Range Status   Fungus Stain  Final report (A)  Final    Comment: (NOTE) Performed At: [facility] [address] [place], [place] [id number] Rush Farmer MD [id number]    Fungus (Mycology) Culture PENDING  Incomplete   Fungal Source TISSUE  Final    Comment: Performed at [facility], [place] [address], [place], [place] [address]  Aerobic/Anaerobic Culture w Gram Stain (surgical/deep wound)     Status: None (Preliminary result)   Collection Time: 09/19/21  9:35 AM   Specimen: PATH Soft tissue  Result Value Ref Range Status   Specimen Description TISSUE  Final   Special Requests LEFT MANDIBLE GRADNULATION TISSUE SPEC B  Final   Gram Stain   Final    NO WBC SEEN NO ORGANISMS SEEN Performed at [facility], [place] [address], [place], [place] [address]    Culture   Final    RARE CANDIDA DUBLINIENSIS CRITICAL  RESULT CALLED TO, READ BACK BY AND VERIFIED WITH: RN B.BETNER AT 1006 ON 09/21/2021 BY T.SAAD. NO ANAEROBES ISOLATED; CULTURE IN PROGRESS FOR 5 DAYS    Report Status PENDING  Incomplete  Fungus Culture Result     Status: Abnormal   Collection Time: 09/19/21  9:35 AM  Result Value Ref Range Status   Result 1 Yeast observed (A)  Final    Comment: (NOTE) Performed At: [facility] [address] [place], [place] [id number] Rush Farmer MD [id number]   MRSA Next Gen by PCR, Nasal     Status: None   Collection Time: 09/19/21 12:46 PM   Specimen: Nasal Mucosa; Nasal Swab  Result Value Ref Range Status   MRSA by PCR Next Gen NOT DETECTED NOT DETECTED Final    Comment: (NOTE) The GeneXpert MRSA Assay (FDA approved for NASAL specimens only), is one component of a comprehensive MRSA colonization surveillance program. It is not intended to diagnose MRSA infection nor to guide or monitor treatment for MRSA infections. Test performance is not FDA approved in patients less than 24 years old. Performed at [facility], [place] [address], [place], [place] [address]   Culture,  blood (Routine X 2) w Reflex to ID Panel     Status: None   Collection Time: 09/19/21  7:00 PM   Specimen: BLOOD RIGHT HAND  Result Value Ref Range Status   Specimen Description BLOOD RIGHT HAND  Final   Special Requests   Final    BOTTLES DRAWN AEROBIC ONLY Blood Culture results may not be optimal due to an inadequate volume of blood received in culture bottles   Culture   Final    NO GROWTH 5 DAYS Performed at [facility], [place] [address], [place], [place] [address]    Report Status 09/24/2021 FINAL  Final  Culture, blood (Routine X 2) w Reflex to ID Panel     Status: None   Collection Time: 09/19/21  7:03 PM   Specimen: BLOOD LEFT HAND  Result Value Ref Range Status   Specimen Description BLOOD LEFT HAND  Final   Special Requests   Final    BOTTLES DRAWN AEROBIC AND ANAEROBIC Blood Culture adequate volume   Culture   Final    NO GROWTH 5 DAYS Performed at [facility], [place] [address], [place], [place] [address]    Report Status 09/24/2021 FINAL  Final    Labs: CBC: Recent Labs  Lab 09/18/21 1505 09/19/21 0416 09/19/21 0816 09/19/21 1159 09/20/21 0633 09/22/21 0110 09/23/21 0232  WBC 7.1 7.2  --   --  7.8 5.2 6.1  NEUTROABS 5.0 2.9  --   --   --  3.5  --   HGB 11.4* 14.5 11.6* 9.9* 8.9* 9.9* 9.3*  HCT 33.6* 42.0 34.0* 29.0* 26.8* 30.1* 28.6*  MCV 89.1 85.0  --   --  90.8 91.8 91.4  PLT 229 250  --   --  278 297 324   Basic Metabolic Panel: Recent Labs  Lab 09/19/21 1105 09/19/21 1159 09/19/21 1903 09/20/21 0633 09/21/21 0121 09/22/21 0110 09/23/21 0232  NA THIS TEST WAS ORDERED IN ERROR AND HAS BEEN CREDITED.  132*   < > 135 135 138 134* 135  K THIS TEST WAS ORDERED IN ERROR AND HAS BEEN CREDITED.  3.0*   < > 3.6 3.4* 3.3* 4.1 3.9  CL THIS TEST WAS ORDERED IN ERROR AND HAS BEEN CREDITED.  94*  --  95*  101 102 103 101  CO2 THIS TEST WAS ORDERED IN ERROR AND HAS BEEN CREDITED.  28  --  '28 27 28 23 27  ' GLUCOSE THIS TEST WAS ORDERED IN ERROR AND  HAS BEEN CREDITED.  153*  --  183* 161* 84 93 92  BUN THIS TEST WAS ORDERED IN ERROR AND HAS BEEN CREDITED.  8  --  '8 7 11 15 11  ' CREATININE THIS TEST WAS ORDERED IN ERROR AND HAS BEEN CREDITED.  0.70  --  0.81 0.68 0.70 0.62 0.68  CALCIUM THIS TEST WAS ORDERED IN ERROR AND HAS BEEN CREDITED.  8.3*  --  8.7* 7.6* 8.3* 8.2* 8.4*  MG 1.5*  --   --  2.1 1.8 1.6* 1.7  PHOS  --   --   --  4.6  --  3.5  --    < > = values in this interval not displayed.   Liver Function Tests: Recent Labs  Lab 09/18/21 1505 09/19/21 0416 09/19/21 1105 09/19/21 1903 09/20/21 0633  AST 12* 11* THIS TEST WAS ORDERED IN ERROR AND HAS BEEN CREDITED.  14* 17  --   ALT 15 13 THIS TEST WAS ORDERED IN ERROR AND HAS BEEN CREDITED.  10 15  --   ALKPHOS 97 88 THIS TEST WAS ORDERED IN ERROR AND HAS BEEN CREDITED.  77 84  --   BILITOT 0.7 0.4 THIS TEST WAS ORDERED IN ERROR AND HAS BEEN CREDITED.  0.5 0.2*  --   PROT 6.5 6.3* THIS TEST WAS ORDERED IN ERROR AND HAS BEEN CREDITED.  5.8* 6.5  --   ALBUMIN 2.7* 2.4* THIS TEST WAS ORDERED IN ERROR AND HAS BEEN CREDITED.  2.3* 2.5* 1.8*   CBG: Recent Labs  Lab 09/22/21 2048 09/23/21 0036 09/23/21 0426 09/23/21 0759 09/23/21 1133  GLUCAP 103* 116* 84 94 105*    Discharge time spent: {LESS THAN/GREATER THAN:26388} 30 minutes.  Signed: Hosie Poisson, MD [facility] 09/24/2021

## 2021-09-25 NOTE — Discharge Summary (Signed)
Physician Discharge Summary   Patient: Carolyn Black MRN: [id number] DOB: 29-May-1960  Admit date:     09/18/2021  Discharge date: 09/23/2021  Discharge Physician: Hosie Poisson   PCP: [facility], [facility]   Recommendations at discharge:  Please follow up with PCP in one week.  Please follow up with ID as scheduled.  Please follow up with dental surgery as recommended.   Discharge Diagnoses: Principal Problem:   Dental abscess Active Problems:   COPD (chronic obstructive pulmonary disease) ([place])   Tobacco abuse counseling   Psoriatic arthritis ([place])   Hypokalemia   Supraglottic airway obstruction   Acute respiratory failure with hypoxia (HCC)  Resolved Problems:   Abscess  Hospital Course: [name] is a 61 y.o. female with past medical history significant for COPD, rheumatoid arthritis, psoriasis, tobacco use disorder who [facility] with worsening facial swelling in the setting of left dental abscess.  Patient was seen by her dentist roughly 1 week prior and was placed on clindamycin.  Followed up with dental on 5/9 and antibiotics changed to Augmentin; however symptoms continued to progress.  Now patient with chills, subjective fevers, worsening pain, trismus and facial edema with poor oral intake.  In the ED, CT showing fluid collections, cellulitis with left submandibular, sublingual and masticator space involvement with minimal airway deviation.  Oral maxillofacial surgery, Dr. Conley Simmonds was consulted and initially admitted to the hospital service; taken to the OR for a I&D.  Postoperatively patient remained intubated for airway protection and was transferred to the [facility] service for further care.  Patient was successfully extubated on 09/20/2021 and transferred back to the hospitalist service on 09/22/2021.    Assessment and Plan:  Left sublingual, submandibular murmur, masticator oral pharyngeal abscess Presenting to ED with progressive facial swelling, pain,  chills and subjective fevers and poor oral intake.  Was being managed by outpatient dental medicine with 2 rounds of antibiotics with clindamycin followed by Augmentin without much effect.  CT on admission notable for fluid collections, cellulitis with left submandibular, sublingual and masticator space involvement with minimal airway deviation; also some concern for osteomyelitis. OMFS, Dr. Conley Simmonds was consulted and patient underwent I&D of abscesses on 09/19/2021. --OMFS and ID following; appreciate assistance --Blood cultures x2 with no growth x3 days  --left masticator space culture with moderate Prevotella oris --Left mandible granulation tissue specimen: Rare yeast --Fungus culture with stain: pending, follow up with PCP.  --Ceftriaxone 2 g IV every 24 hours to complete 6 weeks from 09/19/21. - complete the course of antibiotics with flagyl and diflucan to complete the course of 6 weeks.    COPD Home regimen includes Advair discus 1 puff twice daily and albuterol MDI as needed. --Brovana neb twice daily --Pulmicort neb twice daily --DuoNebs 3 times daily --Albuterol neb every 4 hours as needed wheezing/shortness of breath   Hypomagnesemia Replaced.    Hypokalemia Replaced.    Psoriasis --Kenalog cream twice daily   Anxiety --Xanax prn.    Tobacco use disorder Counseled on need for complete cessation. --Nicotine patch          Consultants: dental surgery  ID  Procedures performed:  Dr. Conley Simmonds was consulted and patient underwent I&D of abscesses on 09/19/2021.  Disposition: Home Diet recommendation:  Discharge Diet Orders (From admission, onward)     Start     Ordered   09/23/21 0000  Diet - low sodium heart healthy        09/23/21 1201  Regular diet DISCHARGE MEDICATION: Allergies as of 09/23/2021   No Known Allergies      Medication List     STOP taking these medications    amoxicillin-clavulanate 875-125 MG tablet Commonly known as:  AUGMENTIN   clindamycin 300 MG capsule Commonly known as: CLEOCIN   traMADol 50 MG tablet Commonly known as: ULTRAM       TAKE these medications    Advair Diskus 250-50 MCG/ACT Aepb Generic drug: fluticasone-salmeterol Inhale 1 puff into the lungs 2 (two) times daily.   albuterol 108 (90 Base) MCG/ACT inhaler Commonly known as: VENTOLIN HFA Inhale into the lungs.   ALPRAZolam 0.25 MG tablet Commonly known as: XANAX Take 0.25 mg by mouth 2 (two) times daily as needed for anxiety.   cefTRIAXone  IVPB Commonly known as: ROCEPHIN Inject 2 g into the vein daily. Indication:  Mandibular osteomyelitis First Dose: Yes Last Day of Therapy:  10/31/2021 Labs - Once weekly:  CBC/D and BMP, Labs - Every other week:  ESR and CRP Method of administration: IV Push Method of administration may be changed at the discretion of home infusion pharmacist based upon assessment of the patient and/or caregiver's ability to self-administer the medication ordered.   cetirizine 10 MG tablet Commonly known as: ZYRTEC Take 10 mg by mouth daily.   chlorhexidine gluconate (MEDLINE KIT) 0.12 % solution Commonly known as: PERIDEX 15 mLs by Mouth Rinse route 2 (two) times daily.   diclofenac 75 MG EC tablet Commonly known as: VOLTAREN Take 75 mg by mouth 2 (two) times daily.   diphenhydrAMINE 25 MG tablet Commonly known as: BENADRYL Take 25 mg by mouth at bedtime as needed for sleep.   feeding supplement Liqd Take 237 mLs by mouth 3 (three) times daily between meals.   fluconazole 200 MG tablet Commonly known as: DIFLUCAN Take 2 tablets (400 mg total) by mouth daily.   lidocaine 5 % ointment Commonly known as: XYLOCAINE Apply topically.   metroNIDAZOLE 500 MG tablet Commonly known as: FLAGYL Take 1 tablet (500 mg total) by mouth every 12 (twelve) hours.   nicotine 21 mg/24hr patch Commonly known as: NICODERM CQ - dosed in mg/24 hours Place 1 patch (21 mg total) onto the skin daily.    oxyCODONE 5 MG immediate release tablet Commonly known as: Oxy IR/ROXICODONE Take 5 mg by mouth See admin instructions. 2 capsules by mouth every 4 to 6 hours as needed for pain   triamcinolone 0.025 % cream Commonly known as: KENALOG Apply topically 2 (two) times daily as needed (psoriasis).   VITAMIN D3 PO Take 50,000 Units by mouth See admin instructions. 1 tablet weekly on Saturdays   white petrolatum Oint Commonly known as: VASELINE Apply 1 application. topically as needed for dry skin.               Discharge Care Instructions  (From admission, onward)           Start     Ordered   09/23/21 0000  Change dressing on IV access line weekly and PRN  (Home infusion instructions - [facility] )        09/23/21 1156            Follow-up Information     [place] [place] Follow up.   Specialty: Home Health Services Why: Merchandiser, retail information: [place] [address] [phone]         [facility] Follow up.   Why: [phone]               Discharge Exam: Filed Weights   09/18/21 1145 09/19/21 0656  Weight: 71.7 kg 71.7 kg   General exam: Appears calm and comfortable  Respiratory system: Clear to auscultation. Respiratory effort normal. Cardiovascular system: S1 & S2 heard, RRR. No JVD, murmurs, rubs, gallops or clicks. No pedal edema. Gastrointestinal system: Abdomen is nondistended, soft and nontender. No organomegaly or masses felt. Normal bowel sounds heard. Central nervous system: Alert and oriented. No focal neurological deficits. Extremities: Symmetric 5 x 5 power. Skin: No rashes, lesions or ulcers Psychiatry: Mood & affect appropriate.    Condition at discharge: fair  The results of significant diagnostics from this hospitalization (including imaging, microbiology, ancillary and laboratory) are listed below for reference.   Imaging Studies: CT Soft Tissue Neck W  Contrast  Addendum Date: 09/18/2021   ADDENDUM REPORT: 09/18/2021 17:26 ADDENDUM: Mild soft tissue thickening involving the left aspect of the epiglottis, tongue base, and lateral oropharynx. This may be inflammatory, however direct visualization is recommended to exclude a mass. Electronically Signed   By: Logan Bores M.D.   On: 09/18/2021 17:26   Result Date: 09/18/2021 CLINICAL DATA:  Left-sided facial soft tissue swelling. Infection suspected. EXAM: CT NECK WITH CONTRAST TECHNIQUE: Multidetector CT imaging of the neck was performed using the standard protocol following the bolus administration of intravenous contrast. RADIATION DOSE REDUCTION: This exam was performed according to the departmental dose-optimization program which includes automated exposure control, adjustment of the mA and/or kV according to patient size and/or use of iterative reconstruction technique. CONTRAST:  55m OMNIPAQUE IOHEXOL 300 MG/ML  SOLN COMPARISON:  None Available. FINDINGS: Pharynx and larynx: Asymmetric, mild thickening involving the left aspect of the epiglottis, left tongue base, and left lateral oropharyngeal wall. No retropharyngeal fluid collection. Salivary glands: Marked asymmetric atrophy of the right submandibular gland. Mass effect on and possibly secondary inflammation of the left parotid gland by the process described below. Thyroid: Unremarkable. Lymph nodes: Mild asymmetric enlargement of left level I and II lymph nodes measuring up to 1 cm in short axis, likely reactive. Vascular: Major vascular structures of the neck are grossly patent. Mild atherosclerotic plaque at the carotid bifurcations. Limited intracranial: Unremarkable. Visualized orbits: Unremarkable. Mastoids and visualized paranasal sinuses: Clear. Skeleton: Largely absent dentition. Caries and periapical lucencies involving the remaining mandibular incisors. Asymmetric left temporomandibular arthritis. Mild cervical spondylosis. Upper chest:  Emphysema. Other: There is inflammation within the soft tissues surrounding the left mandible beginning in the parasymphyseal region and extending posteriorly along the body and ramus. A small fluid collection along the deep margin of the anterior left mandibular body measures up to 4 mm in short axis thickness. There is a larger, peripherally enhancing fluid collection which extends from the deep margin of the angle of the mandible on the left superiorly along the ramus with involvement of the masseter and pterygoid musculature and extension superiorly into the scalp on the left along the temporalis muscle with the superior extent of the collection not included on this study. This collection measures greater than 9 cm in craniocaudal dimension and measures approximately 4.5 cm in AP dimension at the level of the infratemporal fossa and at least 6.5 cm in AP dimension in the left frontotemporal scalp. IMPRESSION: 1. Widespread, likely odontogenic infection involving the left submandibular and left masticator spaces with a large abscess and myositis extending from the masticator space into the scalp with superior extent not included on  this study. 2. Aortic Atherosclerosis (ICD10-I70.0) and Emphysema (ICD10-J43.9). Electronically Signed: By: Logan Bores M.D. On: 09/18/2021 16:39   CT MAXILLOFACIAL W CONTRAST  Result Date: 09/19/2021 CLINICAL DATA:  Maxillary/facial abscess. Concern for osteomyelitis. EXAM: CT MAXILLOFACIAL WITH CONTRAST TECHNIQUE: Multidetector CT imaging of the maxillofacial structures was performed with intravenous contrast. Multiplanar CT image reconstructions were also generated. RADIATION DOSE REDUCTION: This exam was performed according to the departmental dose-optimization program which includes automated exposure control, adjustment of the mA and/or kV according to patient size and/or use of iterative reconstruction technique. CONTRAST:  152m OMNIPAQUE IOHEXOL 350 MG/ML SOLN COMPARISON:   Neck CT 09/18/2021 FINDINGS: Osseous: Edentulous, with interval extraction of the remaining mandibular incisors on the prior CT. Unchanged few small gas bubbles in the left mandibular body without bone erosion in this area. Unchanged widening of the left temporomandibular joint and erosive and sclerotic changes involving the left mandibular condyle. Orbits: Mild left periorbital soft tissue swelling. No postseptal inflammation. Sinuses: Trace mucosal thickening in the left maxillary and left sphenoid sinuses. Clear mastoid air cells and middle ear cavities. Soft tissues: Interval incision and drainage of the left submandibular and left masticator space abscesses extending into the scalp shown on yesterday's CT with drains in place. Persistent extensive inflammation in these regions, particularly in the left masticator space with involvement of the masseter and pterygoid musculature. No sizable residual undrained fluid collection. Small volume gas in the regions of abscess drainage. Unchanged borderline to mildly enlarged left submandibular and left level II lymph nodes, likely reactive. Partially visualized endotracheal tube with small volume fluid/secretions in the pharynx. Limited assessment of the pharyngeal soft tissues. Limited intracranial: Unremarkable. IMPRESSION: 1. Interval drainage of abscesses involving the left masticator and submandibular spaces and scalp. Persistent extensive inflammation in these regions without sizable residual undrained fluid collection. 2. Interval extraction of remaining mandibular teeth. Unchanged few small foci of gas in the left mandibular body, of uncertain etiology and without cortical destruction in this region to clearly indicate osteomyelitis. 3. Widening of the left temporomandibular joint with erosion and sclerosis of the left mandibular condyle which could reflect an inflammatory arthritis or septic arthritis. Electronically Signed   By: ALogan BoresM.D.   On:  09/19/2021 17:07   DG Chest Port 1 View  Result Date: 09/19/2021 CLINICAL DATA:  Dental abscess. EXAM: PORTABLE CHEST 1 VIEW COMPARISON:  None Available. FINDINGS: An endotracheal tube terminates at the level of the clavicles, proximally 5 cm above the carina. The cardiomediastinal silhouette is within normal limits. Mild scarring is noted in the lung apices. There is slight elevation of the left hemidiaphragm with mild left basilar lung opacity most compatible with atelectasis. No sizable pleural effusion or pneumothorax is identified. No acute osseous abnormality is seen. IMPRESSION: 1. Endotracheal tube in satisfactory position. 2. Mild left basilar atelectasis. Electronically Signed   By: ALogan BoresM.D.   On: 09/19/2021 12:03   [place]KG SITE RITE  Result Date: 09/22/2021 If Site Rite image not attached, placement could not be confirmed due to current cardiac rhythm.   Microbiology: Results for orders placed or performed during the hospital encounter of 09/18/21  Resp Panel by RT-PCR (Flu A&B, Covid) Nasopharyngeal Swab     Status: None   Collection Time: 09/18/21  6:19 PM   Specimen: Nasopharyngeal Swab; Nasopharyngeal(NP) swabs in vial transport medium  Result Value Ref Range Status   SARS Coronavirus 2 by RT PCR NEGATIVE NEGATIVE Final    Comment: (NOTE) SARS-CoV-2 target  nucleic acids are NOT DETECTED.  The SARS-CoV-2 RNA is generally detectable in upper respiratory specimens during the acute phase of infection. The lowest concentration of SARS-CoV-2 viral copies this assay can detect is 138 copies/mL. A negative result does not preclude SARS-Cov-2 infection and should not be used as the sole basis for treatment or other patient management decisions. A negative result may occur with  improper specimen collection/handling, submission of specimen other than nasopharyngeal swab, presence of viral mutation(s) within the areas targeted by this assay, and inadequate number of  viral copies(<138 copies/mL). A negative result must be combined with clinical observations, patient history, and epidemiological information. The expected result is Negative.  Fact Sheet for Patients:  EntrepreneurPulse.com.au  Fact Sheet for Healthcare Providers:  IncredibleEmployment.be  This test is no t yet approved or cleared by the [place] FDA and  has been authorized for detection and/or diagnosis of SARS-CoV-2 by FDA under an Emergency Use Authorization (EUA). This EUA will remain  in effect (meaning this test can be used) for the duration of the COVID-19 declaration under Section 564(b)(1) of the Act, 21 U.S.C.section 360bbb-3(b)(1), unless the authorization is terminated  or revoked sooner.       Influenza A by PCR NEGATIVE NEGATIVE Final   Influenza B by PCR NEGATIVE NEGATIVE Final    Comment: (NOTE) The Xpert Xpress SARS-CoV-2/FLU/RSV plus assay is intended as an aid in the diagnosis of influenza from Nasopharyngeal swab specimens and should not be used as a sole basis for treatment. Nasal washings and aspirates are unacceptable for Xpert Xpress SARS-CoV-2/FLU/RSV testing.  Fact Sheet for Patients: EntrepreneurPulse.com.au  Fact Sheet for Healthcare Providers: IncredibleEmployment.be  This test is not yet approved or cleared by the [place] FDA and has been authorized for detection and/or diagnosis of SARS-CoV-2 by FDA under an Emergency Use Authorization (EUA). This EUA will remain in effect (meaning this test can be used) for the duration of the COVID-19 declaration under Section 564(b)(1) of the Act, 21 U.S.C. section 360bbb-3(b)(1), unless the authorization is terminated or revoked.  Performed at [facility], [address], [place], [place] [address]   Aerobic/Anaerobic Culture w Gram Stain (surgical/deep wound)     Status: None   Collection Time: 09/19/21  9:13 AM    Specimen: PATH Other; Body Fluid  Result Value Ref Range Status   Specimen Description WOUND  Final   Special Requests LEFT MASTICATOR SPACE SPEC A  Final   Gram Stain   Final    MODERATE WBC PRESENT, PREDOMINANTLY PMN FEW GRAM NEGATIVE RODS    Culture   Final    MODERATE PREVOTELLA ORIS BETA LACTAMASE POSITIVE Performed at [facility], [address], [place],  [address]    Report Status 09/22/2021 FINAL  Final  Fungus Stain     Status: None   Collection Time: 09/19/21  9:13 AM   Specimen: PATH Other; Body Fluid  Result Value Ref Range Status   FUNGUS STAIN Final report  Final    Comment: (NOTE) Performed At: [facility] [place], [place] [id number] Rush Farmer MD [id number]    Fungal Source LEFT MASTICATOR SPACE SPEC A  Final    Comment: Performed at [facility], [place] [address], [place],  [address]  Fungal Stain reflex     Status: None   Collection Time: 09/19/21  9:13 AM  Result Value Ref Range Status   Fungal stain result 1 Comment  Final    Comment: (  NOTE) KOH/Calcofluor preparation:  no fungus observed. Performed At: [facility] [place], [place] [id number] Rush Farmer MD [id number]   Fungus Culture With Stain     Status: Abnormal (Preliminary result)   Collection Time: 09/19/21  9:35 AM   Specimen: PATH Soft tissue  Result Value Ref Range Status   Fungus Stain Final report (A)  Final    Comment: (NOTE) Performed At: [facility] [address] [place], [place] [id number] Rush Farmer MD [id number]    Fungus (Mycology) Culture PENDING  Incomplete   Fungal Source TISSUE  Final    Comment: Performed at [facility], [place] [address], [place], [place] [address]  Aerobic/Anaerobic Culture w Gram Stain (surgical/deep wound)     Status: None   Collection Time: 09/19/21  9:35 AM   Specimen: PATH Soft tissue  Result Value Ref Range Status   Specimen Description  TISSUE  Final   Special Requests LEFT MANDIBLE GRADNULATION TISSUE SPEC B  Final   Gram Stain NO WBC SEEN NO ORGANISMS SEEN   Final   Culture   Final    RARE CANDIDA DUBLINIENSIS CRITICAL RESULT CALLED TO, READ BACK BY AND VERIFIED WITH: RN B.BETNER AT 3491 ON 09/21/2021 BY T.SAAD. NO ANAEROBES ISOLATED Performed at [facility], [place] [address], [place], [place] [address]    Report Status 09/24/2021 FINAL  Final  Fungus Culture Result     Status: Abnormal   Collection Time: 09/19/21  9:35 AM  Result Value Ref Range Status   Result 1 Yeast observed (A)  Final    Comment: (NOTE) Performed At: [facility] [place], [place] [id number] Rush Farmer MD [id number]   MRSA Next Gen by PCR, Nasal     Status: None   Collection Time: 09/19/21 12:46 PM   Specimen: Nasal Mucosa; Nasal Swab  Result Value Ref Range Status   MRSA by PCR Next Gen NOT DETECTED NOT DETECTED Final    Comment: (NOTE) The GeneXpert MRSA Assay (FDA approved for NASAL specimens only), is one component of a comprehensive MRSA colonization surveillance program. It is not intended to diagnose MRSA infection nor to guide or monitor treatment for MRSA infections. Test performance is not FDA approved in patients less than 67 years old. Performed at [facility], [place] [address] [place], [place] [address]   Culture, blood (Routine X 2) w Reflex to ID Panel     Status: None   Collection Time: 09/19/21  7:00 PM   Specimen: BLOOD RIGHT HAND  Result Value Ref Range Status   Specimen Description BLOOD RIGHT HAND  Final   Special Requests   Final    BOTTLES DRAWN AEROBIC ONLY Blood Culture results may not be optimal due to an inadequate volume of blood received in culture bottles   Culture   Final    NO GROWTH 5 DAYS Performed at [facility], [place] [address] [place], [place] [address]    Report Status 09/24/2021 FINAL  Final  Culture, blood (Routine X 2) w Reflex to ID Panel      Status: None   Collection Time: 09/19/21  7:03 PM   Specimen: BLOOD LEFT HAND  Result Value Ref Range Status   Specimen Description BLOOD LEFT HAND  Final   Special Requests   Final    BOTTLES DRAWN AEROBIC AND ANAEROBIC Blood Culture adequate volume   Culture   Final    NO GROWTH 5 DAYS  Performed at [facility], [place] [address], [place], [place] [address]    Report Status 09/24/2021 FINAL  Final    Labs: CBC: Recent Labs  Lab 09/18/21 1505 09/19/21 0416 09/19/21 0816 09/19/21 1159 09/20/21 0633 09/22/21 0110 09/23/21 0232  WBC 7.1 7.2  --   --  7.8 5.2 6.1  NEUTROABS 5.0 2.9  --   --   --  3.5  --   HGB 11.4* 14.5 11.6* 9.9* 8.9* 9.9* 9.3*  HCT 33.6* 42.0 34.0* 29.0* 26.8* 30.1* 28.6*  MCV 89.1 85.0  --   --  90.8 91.8 91.4  PLT 229 250  --   --  278 297 840   Basic Metabolic Panel: Recent Labs  Lab 09/19/21 1105 09/19/21 1159 09/19/21 1903 09/20/21 0633 09/21/21 0121 09/22/21 0110 09/23/21 0232  NA THIS TEST WAS ORDERED IN ERROR AND HAS BEEN CREDITED.  132*   < > 135 135 138 134* 135  K THIS TEST WAS ORDERED IN ERROR AND HAS BEEN CREDITED.  3.0*   < > 3.6 3.4* 3.3* 4.1 3.9  CL THIS TEST WAS ORDERED IN ERROR AND HAS BEEN CREDITED.  94*  --  95* 101 102 103 101  CO2 THIS TEST WAS ORDERED IN ERROR AND HAS BEEN CREDITED.  28  --  '28 27 28 23 27  ' GLUCOSE THIS TEST WAS ORDERED IN ERROR AND HAS BEEN CREDITED.  153*  --  183* 161* 84 93 92  BUN THIS TEST WAS ORDERED IN ERROR AND HAS BEEN CREDITED.  8  --  '8 7 11 15 11  ' CREATININE THIS TEST WAS ORDERED IN ERROR AND HAS BEEN CREDITED.  0.70  --  0.81 0.68 0.70 0.62 0.68  CALCIUM THIS TEST WAS ORDERED IN ERROR AND HAS BEEN CREDITED.  8.3*  --  8.7* 7.6* 8.3* 8.2* 8.4*  MG 1.5*  --   --  2.1 1.8 1.6* 1.7  PHOS  --   --   --  4.6  --  3.5  --    < > = values in this interval not displayed.   Liver Function Tests: Recent Labs  Lab 09/18/21 1505 09/19/21 0416 09/19/21 1105 09/19/21 1903 09/20/21 0633   AST 12* 11* THIS TEST WAS ORDERED IN ERROR AND HAS BEEN CREDITED.  14* 17  --   ALT 15 13 THIS TEST WAS ORDERED IN ERROR AND HAS BEEN CREDITED.  10 15  --   ALKPHOS 97 88 THIS TEST WAS ORDERED IN ERROR AND HAS BEEN CREDITED.  77 84  --   BILITOT 0.7 0.4 THIS TEST WAS ORDERED IN ERROR AND HAS BEEN CREDITED.  0.5 0.2*  --   PROT 6.5 6.3* THIS TEST WAS ORDERED IN ERROR AND HAS BEEN CREDITED.  5.8* 6.5  --   ALBUMIN 2.7* 2.4* THIS TEST WAS ORDERED IN ERROR AND HAS BEEN CREDITED.  2.3* 2.5* 1.8*   CBG: Recent Labs  Lab 09/22/21 2048 09/23/21 0036 09/23/21 0426 09/23/21 0759 09/23/21 1133  GLUCAP 103* 116* 84 94 105*    Discharge time spent: 58 minutes.   Signed: Hosie Poisson, MD [facility] 09/25/2021

## 2021-09-29 DIAGNOSIS — F419 Anxiety disorder, unspecified: Secondary | ICD-10-CM | POA: Diagnosis not present

## 2021-09-29 DIAGNOSIS — Z452 Encounter for adjustment and management of vascular access device: Secondary | ICD-10-CM | POA: Diagnosis not present

## 2021-09-29 DIAGNOSIS — L4050 Arthropathic psoriasis, unspecified: Secondary | ICD-10-CM | POA: Diagnosis not present

## 2021-09-29 DIAGNOSIS — K047 Periapical abscess without sinus: Secondary | ICD-10-CM | POA: Diagnosis not present

## 2021-09-29 DIAGNOSIS — E876 Hypokalemia: Secondary | ICD-10-CM | POA: Diagnosis not present

## 2021-09-29 DIAGNOSIS — J9601 Acute respiratory failure with hypoxia: Secondary | ICD-10-CM | POA: Diagnosis not present

## 2021-09-29 DIAGNOSIS — Z5181 Encounter for therapeutic drug level monitoring: Secondary | ICD-10-CM | POA: Diagnosis not present

## 2021-09-29 DIAGNOSIS — F17210 Nicotine dependence, cigarettes, uncomplicated: Secondary | ICD-10-CM | POA: Diagnosis not present

## 2021-09-29 DIAGNOSIS — J449 Chronic obstructive pulmonary disease, unspecified: Secondary | ICD-10-CM | POA: Diagnosis not present

## 2021-09-29 DIAGNOSIS — M272 Inflammatory conditions of jaws: Secondary | ICD-10-CM | POA: Diagnosis not present

## 2021-09-29 DIAGNOSIS — J386 Stenosis of larynx: Secondary | ICD-10-CM | POA: Diagnosis not present

## 2021-09-29 DIAGNOSIS — M069 Rheumatoid arthritis, unspecified: Secondary | ICD-10-CM | POA: Diagnosis not present

## 2021-09-29 DIAGNOSIS — Z792 Long term (current) use of antibiotics: Secondary | ICD-10-CM | POA: Diagnosis not present

## 2021-10-01 DIAGNOSIS — K047 Periapical abscess without sinus: Secondary | ICD-10-CM | POA: Diagnosis not present

## 2021-10-06 ENCOUNTER — Ambulatory Visit: Payer: Medicare HMO | Admitting: Infectious Diseases

## 2021-10-06 ENCOUNTER — Encounter: Payer: Self-pay | Admitting: Infectious Diseases

## 2021-10-06 ENCOUNTER — Other Ambulatory Visit: Payer: Self-pay

## 2021-10-06 VITALS — BP 117/80 | HR 77 | Temp 97.3°F | Wt 150.0 lb

## 2021-10-06 DIAGNOSIS — Z5181 Encounter for therapeutic drug level monitoring: Secondary | ICD-10-CM | POA: Diagnosis not present

## 2021-10-06 DIAGNOSIS — Z452 Encounter for adjustment and management of vascular access device: Secondary | ICD-10-CM

## 2021-10-06 DIAGNOSIS — M869 Osteomyelitis, unspecified: Secondary | ICD-10-CM | POA: Insufficient documentation

## 2021-10-06 DIAGNOSIS — J386 Stenosis of larynx: Secondary | ICD-10-CM | POA: Diagnosis not present

## 2021-10-06 DIAGNOSIS — L4050 Arthropathic psoriasis, unspecified: Secondary | ICD-10-CM

## 2021-10-06 DIAGNOSIS — R69 Illness, unspecified: Secondary | ICD-10-CM | POA: Diagnosis not present

## 2021-10-06 DIAGNOSIS — M069 Rheumatoid arthritis, unspecified: Secondary | ICD-10-CM | POA: Diagnosis not present

## 2021-10-06 DIAGNOSIS — F419 Anxiety disorder, unspecified: Secondary | ICD-10-CM | POA: Diagnosis not present

## 2021-10-06 DIAGNOSIS — M8610 Other acute osteomyelitis, unspecified site: Secondary | ICD-10-CM

## 2021-10-06 DIAGNOSIS — F17200 Nicotine dependence, unspecified, uncomplicated: Secondary | ICD-10-CM | POA: Diagnosis not present

## 2021-10-06 DIAGNOSIS — J449 Chronic obstructive pulmonary disease, unspecified: Secondary | ICD-10-CM | POA: Diagnosis not present

## 2021-10-06 DIAGNOSIS — M272 Inflammatory conditions of jaws: Secondary | ICD-10-CM | POA: Diagnosis not present

## 2021-10-06 DIAGNOSIS — Z792 Long term (current) use of antibiotics: Secondary | ICD-10-CM | POA: Diagnosis not present

## 2021-10-06 DIAGNOSIS — F17210 Nicotine dependence, cigarettes, uncomplicated: Secondary | ICD-10-CM | POA: Diagnosis not present

## 2021-10-06 DIAGNOSIS — E876 Hypokalemia: Secondary | ICD-10-CM | POA: Diagnosis not present

## 2021-10-06 DIAGNOSIS — J9601 Acute respiratory failure with hypoxia: Secondary | ICD-10-CM | POA: Diagnosis not present

## 2021-10-06 DIAGNOSIS — K047 Periapical abscess without sinus: Secondary | ICD-10-CM | POA: Diagnosis not present

## 2021-10-06 NOTE — Progress Notes (Addendum)
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     albuterol (VENTOLIN HFA) 108 (90 Base) MCG/ACT inhaler Inhale into the lungs.     cefTRIAXone (ROCEPHIN) IVPB Inject 2 g into the vein daily. Indication:  Mandibular osteomyelitis First Dose: Yes Last Day of Therapy:  10/31/2021 Labs - Once weekly:  CBC/D and BMP, Labs - Every other week:  ESR and CRP Method of administration: IV Push Method of administration may be changed at the discretion of home infusion pharmacist based upon assessment of the patient and/or caregiver's ability to self-administer the medication ordered. 38 Units 0   cetirizine (ZYRTEC) 10 MG tablet Take 10 mg by mouth daily.     chlorhexidine gluconate, MEDLINE KIT, (PERIDEX) 0.12 % solution 15 mLs by Mouth Rinse route 2 (two) times daily. 120 mL 0   Cholecalciferol (VITAMIN D3 PO) Take 50,000 Units by mouth See admin instructions. 1 tablet weekly on Saturdays     diclofenac (VOLTAREN) 75 MG EC tablet Take 75 mg by mouth 2 (two) times daily.     feeding supplement (ENSURE ENLIVE / ENSURE PLUS) LIQD Take 237 mLs by mouth 3 (three) times daily between meals. 21330 mL 1   fluconazole (DIFLUCAN) 200 MG tablet Take 2 tablets (400 mg total) by mouth daily. 74 tablet 0   metroNIDAZOLE (FLAGYL) 500 MG tablet Take 1 tablet (500 mg total) by mouth every 12 (twelve) hours. 76 tablet 0   nicotine (NICODERM CQ - DOSED IN MG/24 HOURS) 21 mg/24hr patch Place 1 patch (21 mg total) onto the skin daily. 28 patch  0   triamcinolone (KENALOG) 0.025 % cream Apply topically 2 (two) times daily as needed (psoriasis). 30 g 0   white petrolatum (VASELINE) OINT Apply 1 application. topically as needed for dry skin.  0   diphenhydrAMINE (BENADRYL) 25 MG tablet Take 25 mg by mouth at bedtime as needed for sleep.     No current facility-administered medications on file prior to visit.    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 Patient accompanied by her son. She is getting IV ceftriaxone without any issues through PICC. She is taking 3 pills a day but unsure which one is which. He son and DIL takes care of her medications. Discussed with son about dosing instructions for metronidazole and fluconazole. Saw her oral surgeon last week who is moving out of state in 2 weeks. She was told to find a newOMF Psychologist, sport and exercise. He also told them that infection seems to have come back. Denies fevers, and sweats. Feels chills all the time. Denies nausea, vomiting, abdominal pain but having 2-3 episodes of loose stool. Left facial pain worsening in the last few days and feels throbbing in the left ear. She is able to eat. She also has psoriatic arthritis and was previously on methotrexate, Embrel and was supposed to start on Taltz ( Ixekizumab) but was cancelled due to her odontogenic infection. She is  trying to find a new OMF surgeon in the [place] area.   Review of Systems: ROS all systems reviewed with pertinent positives and negatives as listed above  Past Medical History:  Diagnosis Date   Anxiety    COPD (chronic obstructive pulmonary disease) (HCC)    Depression    Dyspnea    Psoriasis    Rheumatoid arteritis ([place])    Seizures ([place])    Past Surgical History:  Procedure Laterality Date   INCISION AND DRAINAGE ABSCESS N/A 09/19/2021   Procedure: INCISION AND DRAINAGE ABSCESS WITH EXTRACTION OF TEETH  23,24,25,26;  Surgeon: Newt Lukes, DMD;  Location: [facility];  Service: Oral Surgery;  Laterality: N/A;   KNEE SURGERY Right    LAPAROSCOPIC HYSTERECTOMY      Social History   Tobacco Use   Smoking status: Every Day    Packs/day: 1.00    Years: 40.00    Pack years: 40.00    Types: Cigarettes   Smokeless tobacco: Never  Vaping Use   Vaping Use: Every day    No family history on file.  No Known Allergies  Health Maintenance  Topic Date Due   COVID-19 Vaccine (1) Never done   Hepatitis C Screening  Never done   TETANUS/TDAP  Never done   PAP SMEAR-Modifier  Never done   COLONOSCOPY (Pts 45-67yr Insurance coverage will need to be confirmed)  Never done   MAMMOGRAM  Never done   Zoster Vaccines- Shingrix (1 of 2) Never done   INFLUENZA VACCINE  12/08/2021   HIV Screening  Completed   HPV VACCINES  Aged Out    Objective: BP 117/80   Pulse 77   Temp (!) 97.3 F (36.3 C) (Oral)   Wt 150 lb (68 kg)   SpO2 98%   BMI 26.57 kg/m    Physical Exam Constitutional:      Appearance: Normal appearance.  HENT:     Head: Normocephalic and atraumatic.      Mouth: left facial swelling without overlying erythema/tenderness and fluctuance. Oral cavity without any open ulcers/drainage. Dentures +. No palpable cervical lymphadenopathy  Eyes:    Conjunctiva/sclera: Conjunctivae normal.     Pupils:  Cardiovascular:     Rate and Rhythm: Normal rate and regular rhythm.     Heart sounds:  Pulmonary:     Effort: Pulmonary effort is normal.     Breath sounds: Normal breath sounds.   Abdominal:     General: Non distended     Palpations: soft.   Musculoskeletal:        General: Normal range of motion.   Skin:    General: Skin is warm and dry.     Comments: multiple psoriatic rashes in the bilateral upper and lower extremities   Neurological:     General: grossly non focal     Mental Status: awake, alert and oriented to person, place, and time.   Psychiatric:         Mood and Affect: Mood normal.   Lab Results Lab Results  Component Value Date   WBC 6.1 09/23/2021   HGB 9.3 (L) 09/23/2021   HCT 28.6 (L) 09/23/2021   MCV 91.4 09/23/2021   PLT 283 09/23/2021    Lab Results  Component Value Date   CREATININE 0.68 09/23/2021   BUN 11 09/23/2021   NA 135 09/23/2021   K 3.9 09/23/2021   CL 101 09/23/2021   CO2 27 09/23/2021    Lab Results  Component Value Date  ALT 15 09/19/2021   AST 17 09/19/2021   ALKPHOS 84 09/19/2021   BILITOT 0.2 (L) 09/19/2021    Lab Results  Component Value Date   TRIG 471 (H) 09/20/2021   No results found for: LABRPR, RPRTITER No results found for: HIV1RNAQUANT, HIV1RNAVL, CD4TABS   Problem List Items Addressed This Visit       Musculoskeletal and Integument   Psoriatic arthritis ([place])   Osteomyelitis ([place]) - Primary   Relevant Orders   CT MAXILLOFACIAL W CONTRAST   Ambulatory referral to Oral Maxillofacial Surgery     Other   Medication monitoring encounter   Smoking   PICC (peripherally inserted central catheter) in place    Assessment/Plan Extensive Odontogenic Infection with  associated abscesses in the left masticator, sublingual and submandibular space with concerns of mandibular Osteomyelitis in OR 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 - Continue IV ceftriaxone, po metronidazole and po fluconazole as is to complete 6 weeks. EOT 10/31/21 - CT maxillofacial given persistent left facial swelling and pain - Amb referral to OMF surgeon as her previous surgeon is moving out of state.   2. Medication  Monitoring 5/17 CBC and BMP unremarkable  3. PICC  - no issues   4. Psoriatic arthritis   - on kenalog cream   5. Smoking - would benefit for smoking cessation  I have personally spent 42  minutes involved in face-to-face and non-face-to-face activities for this patient on the day of the visit. Professional time spent includes the following activities: Preparing to see the patient (review of tests), Obtaining and/or reviewing separately obtained history (admission/discharge record), Performing a medically appropriate examination and/or evaluation , Ordering medications/tests/procedures, referring and communicating with other health care professionals, Documenting clinical information in the EMR, Independently interpreting results (not separately reported), Communicating results to the patient/family/caregiver, Counseling and educating the patient/family/caregiver and Care coordination (not separately reported).   Wilber Oliphant, [place] [facility] 10/06/2021, 9:21 AM

## 2021-10-07 DIAGNOSIS — Z5181 Encounter for therapeutic drug level monitoring: Secondary | ICD-10-CM | POA: Diagnosis not present

## 2021-10-07 DIAGNOSIS — M069 Rheumatoid arthritis, unspecified: Secondary | ICD-10-CM | POA: Diagnosis not present

## 2021-10-07 DIAGNOSIS — Z792 Long term (current) use of antibiotics: Secondary | ICD-10-CM | POA: Diagnosis not present

## 2021-10-07 DIAGNOSIS — J449 Chronic obstructive pulmonary disease, unspecified: Secondary | ICD-10-CM | POA: Diagnosis not present

## 2021-10-07 DIAGNOSIS — Z452 Encounter for adjustment and management of vascular access device: Secondary | ICD-10-CM | POA: Diagnosis not present

## 2021-10-07 DIAGNOSIS — J9601 Acute respiratory failure with hypoxia: Secondary | ICD-10-CM | POA: Diagnosis not present

## 2021-10-07 DIAGNOSIS — J386 Stenosis of larynx: Secondary | ICD-10-CM | POA: Diagnosis not present

## 2021-10-07 DIAGNOSIS — F419 Anxiety disorder, unspecified: Secondary | ICD-10-CM | POA: Diagnosis not present

## 2021-10-07 DIAGNOSIS — E876 Hypokalemia: Secondary | ICD-10-CM | POA: Diagnosis not present

## 2021-10-07 DIAGNOSIS — L4050 Arthropathic psoriasis, unspecified: Secondary | ICD-10-CM | POA: Diagnosis not present

## 2021-10-07 DIAGNOSIS — M272 Inflammatory conditions of jaws: Secondary | ICD-10-CM | POA: Diagnosis not present

## 2021-10-07 DIAGNOSIS — F17210 Nicotine dependence, cigarettes, uncomplicated: Secondary | ICD-10-CM | POA: Diagnosis not present

## 2021-10-07 DIAGNOSIS — K047 Periapical abscess without sinus: Secondary | ICD-10-CM | POA: Diagnosis not present

## 2021-10-08 DIAGNOSIS — L4050 Arthropathic psoriasis, unspecified: Secondary | ICD-10-CM | POA: Diagnosis not present

## 2021-10-08 DIAGNOSIS — K047 Periapical abscess without sinus: Secondary | ICD-10-CM | POA: Diagnosis not present

## 2021-10-08 DIAGNOSIS — L403 Pustulosis palmaris et plantaris: Secondary | ICD-10-CM | POA: Diagnosis not present

## 2021-10-09 ENCOUNTER — Ambulatory Visit (HOSPITAL_BASED_OUTPATIENT_CLINIC_OR_DEPARTMENT_OTHER)
Admission: RE | Admit: 2021-10-09 | Discharge: 2021-10-09 | Disposition: A | Discharge status: Home / Self Care | Payer: Medicare HMO | Source: Ambulatory Visit | Attending: Infectious Diseases | Admitting: Infectious Diseases

## 2021-10-09 DIAGNOSIS — R519 Headache, unspecified: Secondary | ICD-10-CM | POA: Diagnosis not present

## 2021-10-09 DIAGNOSIS — M8610 Other acute osteomyelitis, unspecified site: Secondary | ICD-10-CM | POA: Diagnosis not present

## 2021-10-09 MED ORDER — IOHEXOL 300 MG/ML  SOLN
100.0000 mL | Freq: Once | INTRAMUSCULAR | Status: AC | PRN
  Administered 2021-10-09: 75 mL via INTRAVENOUS

## 2021-10-12 ENCOUNTER — Telehealth: Payer: Self-pay

## 2021-10-12 NOTE — Telephone Encounter (Signed)
-----   Message from Odette Fraction, MD sent at 10/12/2021  2:02 PM EDT ----- Please let patient know CT showed overall improved findings Will discuss more in next clinic visit if additional questions.

## 2021-10-13 ENCOUNTER — Inpatient Hospital Stay: Payer: Medicare HMO | Admitting: Internal Medicine

## 2021-10-13 DIAGNOSIS — K047 Periapical abscess without sinus: Secondary | ICD-10-CM | POA: Diagnosis not present

## 2021-10-13 DIAGNOSIS — M069 Rheumatoid arthritis, unspecified: Secondary | ICD-10-CM | POA: Diagnosis not present

## 2021-10-13 DIAGNOSIS — Z792 Long term (current) use of antibiotics: Secondary | ICD-10-CM | POA: Diagnosis not present

## 2021-10-13 DIAGNOSIS — E876 Hypokalemia: Secondary | ICD-10-CM | POA: Diagnosis not present

## 2021-10-13 DIAGNOSIS — Z5181 Encounter for therapeutic drug level monitoring: Secondary | ICD-10-CM | POA: Diagnosis not present

## 2021-10-13 DIAGNOSIS — M272 Inflammatory conditions of jaws: Secondary | ICD-10-CM | POA: Diagnosis not present

## 2021-10-13 DIAGNOSIS — J9601 Acute respiratory failure with hypoxia: Secondary | ICD-10-CM | POA: Diagnosis not present

## 2021-10-13 DIAGNOSIS — L4050 Arthropathic psoriasis, unspecified: Secondary | ICD-10-CM | POA: Diagnosis not present

## 2021-10-13 DIAGNOSIS — F419 Anxiety disorder, unspecified: Secondary | ICD-10-CM | POA: Diagnosis not present

## 2021-10-13 DIAGNOSIS — J386 Stenosis of larynx: Secondary | ICD-10-CM | POA: Diagnosis not present

## 2021-10-13 DIAGNOSIS — F17210 Nicotine dependence, cigarettes, uncomplicated: Secondary | ICD-10-CM | POA: Diagnosis not present

## 2021-10-13 DIAGNOSIS — Z452 Encounter for adjustment and management of vascular access device: Secondary | ICD-10-CM | POA: Diagnosis not present

## 2021-10-13 DIAGNOSIS — J449 Chronic obstructive pulmonary disease, unspecified: Secondary | ICD-10-CM | POA: Diagnosis not present

## 2021-10-15 DIAGNOSIS — K047 Periapical abscess without sinus: Secondary | ICD-10-CM | POA: Diagnosis not present

## 2021-10-20 DIAGNOSIS — E876 Hypokalemia: Secondary | ICD-10-CM | POA: Diagnosis not present

## 2021-10-20 DIAGNOSIS — J449 Chronic obstructive pulmonary disease, unspecified: Secondary | ICD-10-CM | POA: Diagnosis not present

## 2021-10-20 DIAGNOSIS — J9601 Acute respiratory failure with hypoxia: Secondary | ICD-10-CM | POA: Diagnosis not present

## 2021-10-20 DIAGNOSIS — M069 Rheumatoid arthritis, unspecified: Secondary | ICD-10-CM | POA: Diagnosis not present

## 2021-10-20 DIAGNOSIS — F17210 Nicotine dependence, cigarettes, uncomplicated: Secondary | ICD-10-CM | POA: Diagnosis not present

## 2021-10-20 DIAGNOSIS — F419 Anxiety disorder, unspecified: Secondary | ICD-10-CM | POA: Diagnosis not present

## 2021-10-20 DIAGNOSIS — L4050 Arthropathic psoriasis, unspecified: Secondary | ICD-10-CM | POA: Diagnosis not present

## 2021-10-20 DIAGNOSIS — Z792 Long term (current) use of antibiotics: Secondary | ICD-10-CM | POA: Diagnosis not present

## 2021-10-20 DIAGNOSIS — M272 Inflammatory conditions of jaws: Secondary | ICD-10-CM | POA: Diagnosis not present

## 2021-10-20 DIAGNOSIS — K047 Periapical abscess without sinus: Secondary | ICD-10-CM | POA: Diagnosis not present

## 2021-10-20 DIAGNOSIS — Z5181 Encounter for therapeutic drug level monitoring: Secondary | ICD-10-CM | POA: Diagnosis not present

## 2021-10-20 DIAGNOSIS — J386 Stenosis of larynx: Secondary | ICD-10-CM | POA: Diagnosis not present

## 2021-10-20 DIAGNOSIS — Z452 Encounter for adjustment and management of vascular access device: Secondary | ICD-10-CM | POA: Diagnosis not present

## 2021-10-21 LAB — FUNGUS CULTURE WITH STAIN

## 2021-10-21 LAB — FUNGUS CULTURE RESULT

## 2021-10-21 LAB — FUNGAL ORGANISM REFLEX

## 2021-10-22 DIAGNOSIS — K047 Periapical abscess without sinus: Secondary | ICD-10-CM | POA: Diagnosis not present

## 2021-10-24 DIAGNOSIS — F419 Anxiety disorder, unspecified: Secondary | ICD-10-CM | POA: Diagnosis not present

## 2021-10-24 DIAGNOSIS — M272 Inflammatory conditions of jaws: Secondary | ICD-10-CM | POA: Diagnosis not present

## 2021-10-24 DIAGNOSIS — J9601 Acute respiratory failure with hypoxia: Secondary | ICD-10-CM | POA: Diagnosis not present

## 2021-10-24 DIAGNOSIS — Z452 Encounter for adjustment and management of vascular access device: Secondary | ICD-10-CM | POA: Diagnosis not present

## 2021-10-24 DIAGNOSIS — J449 Chronic obstructive pulmonary disease, unspecified: Secondary | ICD-10-CM | POA: Diagnosis not present

## 2021-10-24 DIAGNOSIS — F17210 Nicotine dependence, cigarettes, uncomplicated: Secondary | ICD-10-CM | POA: Diagnosis not present

## 2021-10-24 DIAGNOSIS — Z5181 Encounter for therapeutic drug level monitoring: Secondary | ICD-10-CM | POA: Diagnosis not present

## 2021-10-24 DIAGNOSIS — Z792 Long term (current) use of antibiotics: Secondary | ICD-10-CM | POA: Diagnosis not present

## 2021-10-24 DIAGNOSIS — J386 Stenosis of larynx: Secondary | ICD-10-CM | POA: Diagnosis not present

## 2021-10-24 DIAGNOSIS — K047 Periapical abscess without sinus: Secondary | ICD-10-CM | POA: Diagnosis not present

## 2021-10-24 DIAGNOSIS — L4050 Arthropathic psoriasis, unspecified: Secondary | ICD-10-CM | POA: Diagnosis not present

## 2021-10-24 DIAGNOSIS — M069 Rheumatoid arthritis, unspecified: Secondary | ICD-10-CM | POA: Diagnosis not present

## 2021-10-24 DIAGNOSIS — E876 Hypokalemia: Secondary | ICD-10-CM | POA: Diagnosis not present

## 2021-10-27 ENCOUNTER — Other Ambulatory Visit: Payer: Self-pay

## 2021-10-27 ENCOUNTER — Ambulatory Visit: Payer: Medicare HMO | Admitting: Infectious Diseases

## 2021-10-27 ENCOUNTER — Telehealth: Payer: Self-pay

## 2021-10-27 VITALS — BP 126/79 | HR 68 | Temp 97.9°F | Resp 16 | Ht 63.0 in | Wt 147.0 lb

## 2021-10-27 DIAGNOSIS — L4050 Arthropathic psoriasis, unspecified: Secondary | ICD-10-CM | POA: Diagnosis not present

## 2021-10-27 DIAGNOSIS — L403 Pustulosis palmaris et plantaris: Secondary | ICD-10-CM | POA: Diagnosis not present

## 2021-10-27 DIAGNOSIS — M8610 Other acute osteomyelitis, unspecified site: Secondary | ICD-10-CM

## 2021-10-27 DIAGNOSIS — Z452 Encounter for adjustment and management of vascular access device: Secondary | ICD-10-CM

## 2021-10-27 DIAGNOSIS — Z79899 Other long term (current) drug therapy: Secondary | ICD-10-CM | POA: Diagnosis not present

## 2021-10-27 DIAGNOSIS — K047 Periapical abscess without sinus: Secondary | ICD-10-CM | POA: Diagnosis not present

## 2021-10-27 DIAGNOSIS — Z5181 Encounter for therapeutic drug level monitoring: Secondary | ICD-10-CM | POA: Diagnosis not present

## 2021-10-27 NOTE — Telephone Encounter (Signed)
Per Dr Elinor Parkinson,  [facility] to pull picc after last dose on 10/31/21 as OPAT states.  Notified [facility], [facility], and [facility].

## 2021-10-27 NOTE — Progress Notes (Signed)
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     albuterol (VENTOLIN HFA) 108 (90 Base) MCG/ACT inhaler Inhale into the lungs.     cefTRIAXone (ROCEPHIN) 10 g injection      cefTRIAXone (ROCEPHIN) IVPB Inject 2 g into the vein daily. Indication:  Mandibular osteomyelitis First Dose: Yes Last Day of Therapy:  10/31/2021 Labs - Once weekly:  CBC/D and BMP, Labs - Every other week:  ESR and CRP Method of administration: IV Push Method of administration may be changed at the discretion of home infusion pharmacist based upon assessment of the patient and/or caregiver's ability to self-administer the medication ordered. 38 Units 0   cetirizine (ZYRTEC) 10 MG tablet Take 10 mg by mouth daily.     chlorhexidine gluconate, MEDLINE KIT, (PERIDEX) 0.12 % solution 15 mLs by Mouth Rinse route 2 (two) times daily. 120 mL 0   Cholecalciferol (VITAMIN D3 PO) Take 50,000 Units by mouth See admin instructions. 1 tablet weekly on Saturdays     diclofenac (VOLTAREN) 75 MG EC tablet Take 75 mg by mouth 2 (two) times daily.     diphenhydrAMINE (BENADRYL) 25 MG tablet Take 25 mg by mouth at bedtime as needed for sleep.     feeding supplement (ENSURE ENLIVE / ENSURE PLUS) LIQD Take 237 mLs by mouth 3 (three) times daily between meals. 21330 mL 1   fluconazole  (DIFLUCAN) 200 MG tablet Take 2 tablets (400 mg total) by mouth daily. 74 tablet 0   metroNIDAZOLE (FLAGYL) 500 MG tablet Take 1 tablet (500 mg total) by mouth every 12 (twelve) hours. 76 tablet 0   nicotine (NICODERM CQ - DOSED IN MG/24 HOURS) 21 mg/24hr patch Place 1 patch (21 mg total) onto the skin daily. 28 patch 0   triamcinolone (KENALOG) 0.025 % cream Apply topically 2 (two) times daily as needed (psoriasis). 30 g 0   white petrolatum (VASELINE) OINT Apply 1 application. topically as needed for dry skin.  0   No current facility-administered medications on file prior to visit.    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 Patient accompanied by her son. She is getting IV ceftriaxone without any issues through PICC. She is taking 3 pills a day but unsure which one is which. He son and DIL takes care of her medications. Discussed with son about dosing instructions for metronidazole and fluconazole. Saw her oral surgeon last week who is moving out of state in 2 weeks. She was told to find a newOMF Psychologist, sport and exercise. He also told them that infection seems to have come back. Denies fevers, and sweats. Feels chills all the time. Denies nausea, vomiting, abdominal pain but having 2-3 episodes of loose stool. Left facial pain worsening in the last few days and feels throbbing in the left ear.  She is able to eat. She also has psoriatic arthritis and was previously on methotrexate, Embrel and was supposed to start on Taltz ( Ixekizumab) but was cancelled due to her odontogenic infection. She is trying to find a new OMF Psychologist, sport and exercise in the [place] area.   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 Accompanied by her son. Getting IV ceftriaxone and PO metronidazole/Fluconazole. Thinks she is  out of PO fluconazole for 2 days  but not sure which one it is. She will call once she gets back to home and let us know about the refill. Complains of throbbing pain at the left ear at times but overall improving. Denies any ear drainage, hearing difficulty or dizziness. Denies fevers, chills, sweats. Denies nausea, vomiting  and abdominal pain. But approx 2 episodes of loose stool in a day. She is able to swallow without any difficulty. But mostly swallows in the rt side due to caution. She has been contacted by Chief Financial Officer at [facility] and planning to see them after today's appointment. Discussed CT maxillofacial findings with her.   Review of Systems: ROS all systems reviewed with pertinent positives and negatives as listed above  Past Medical History:  Diagnosis Date   Anxiety    COPD (chronic obstructive pulmonary disease) (HCC)    Depression    Dyspnea    Psoriasis    Rheumatoid arteritis ([place])    Seizures ([place])    Past Surgical History:  Procedure Laterality Date   INCISION AND DRAINAGE ABSCESS N/A 09/19/2021   Procedure: INCISION AND DRAINAGE ABSCESS WITH EXTRACTION OF TEETH 23,24,25,26;  Surgeon: Newt Lukes, DMD;  Location: [facility];  Service: Oral Surgery;  Laterality: N/A;   KNEE SURGERY Right    LAPAROSCOPIC HYSTERECTOMY      Social History   Tobacco Use   Smoking status: Every Day    Packs/day: 1.00    Years: 40.00    Total pack years: 40.00    Types: Cigarettes   Smokeless tobacco: Never  Vaping Use   Vaping Use: Every day    No family history on file.  No Known Allergies  Health Maintenance  Topic Date Due   COVID-19 Vaccine (1) Never done   Hepatitis C Screening  Never done   TETANUS/TDAP  Never done   PAP SMEAR-Modifier  Never done   COLONOSCOPY (Pts 45-44yr Insurance coverage will need to be confirmed)  Never done   MAMMOGRAM  Never done   Zoster Vaccines- Shingrix (1 of 2) Never done   INFLUENZA VACCINE  12/08/2021   HIV Screening  Completed   HPV VACCINES  Aged Out     Objective: BP 126/79   Pulse 68   Temp 97.9 F (36.6 C)   Resp 16   Ht 5' 3" (1.6 m)   Wt 147 lb (66.7 kg)   SpO2 98%   BMI 26.04 kg/m     Physical Exam Constitutional:      Appearance: Normal appearance.  HENT:     Head: Normocephalic and atraumatic.      Mouth: left facial swelling improved without overlying erythema/tenderness and fluctuance. Oral cavity without any open ulcers/drainage. No palpable cervical lymphadenopathy  Left ear - external ear and EAC WNL, Wax +  Eyes:    Conjunctiva/sclera: Conjunctivae normal.     Pupils:  Cardiovascular:     Rate and Rhythm: Normal rate and regular rhythm.     Heart sounds:  Pulmonary:  Effort: Pulmonary effort is normal.     Breath sounds: Normal breath sounds.   Abdominal:     General: Non distended     Palpations: soft.   Musculoskeletal:        General: Normal range of motion.   Skin:    General: Skin is warm and dry.     Comments: multiple psoriatic rashes in the bilateral upper and lower extremities   Neurological:     General: grossly non focal     Mental Status: awake, alert and oriented to person, place, and time.   Psychiatric:        Mood and Affect: Mood normal.   Lab Results Lab Results  Component Value Date   WBC 6.1 09/23/2021   HGB 9.3 (L) 09/23/2021   HCT 28.6 (L) 09/23/2021   MCV 91.4 09/23/2021   PLT 283 09/23/2021    Lab Results  Component Value Date   CREATININE 0.68 09/23/2021   BUN 11 09/23/2021   NA 135 09/23/2021   K 3.9 09/23/2021   CL 101 09/23/2021   CO2 27 09/23/2021    Lab Results  Component Value Date   ALT 15 09/19/2021   AST 17 09/19/2021   ALKPHOS 84 09/19/2021   BILITOT 0.2 (L) 09/19/2021    Lab Results  Component Value Date   TRIG 471 (H) 09/20/2021   No results found for: "LABRPR", "RPRTITER" No results found for: "HIV1RNAQUANT", "HIV1RNAVL", "CD4TABS"   Imaging CT MAXILLOFACIAL W CONTRAST  Result Date: 10/12/2021 CLINICAL DATA:  Worsening  pain and left cheek with concern for worsening infection EXAM: CT MAXILLOFACIAL WITH CONTRAST TECHNIQUE: Multidetector CT imaging of the maxillofacial structures was performed with intravenous contrast. Multiplanar CT image reconstructions were also generated. RADIATION DOSE REDUCTION: This exam was performed according to the departmental dose-optimization program which includes automated exposure control, adjustment of the mA and/or kV according to patient size and/or use of iterative reconstruction technique. CONTRAST:  51m OMNIPAQUE IOHEXOL 300 MG/ML  SOLN COMPARISON:  09/19/2021 FINDINGS: Osseous: No new erosive changes. Irregularity of the left mandibular condylar head has not progressed. Orbits: Unremarkable. Sinuses: Minor mucosal thickening. Soft tissues: Left facial inflammatory changes has substantially improved and are nearly resolved. There is no abscess. Limited intracranial: No abnormal enhancement. IMPRESSION: Substantially improved and nearly resolved inflammatory changes. No abscess. No new or progressive osseous erosive changes Electronically Signed   By: PMacy MisM.D.   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 - Continue IV ceftriaxone, po metronidazole and po fluconazole as is to complete 6 weeks. EOT 10/31/21. No changes in OPAT - [facility] - Fu  as needed or in case of worsening symptoms  2. Medication Monitoring - Last set of labs 6/13 pending at this time.   3. PICC  - no issues  - To be removed after last dose on 10/31/21  4. Psoriatic arthritis   - on kenalog cream   I have personally spent 40  minutes involved in face-to-face and non-face-to-face activities for this patient on the day of the visit including counseling of the patient and coordination of care.   Wilber Oliphant, [place] for Infectious Disease [facility] 10/27/2021, 9:10 AM

## 2021-10-28 DIAGNOSIS — E876 Hypokalemia: Secondary | ICD-10-CM | POA: Diagnosis not present

## 2021-10-28 DIAGNOSIS — J9601 Acute respiratory failure with hypoxia: Secondary | ICD-10-CM | POA: Diagnosis not present

## 2021-10-28 DIAGNOSIS — M272 Inflammatory conditions of jaws: Secondary | ICD-10-CM | POA: Diagnosis not present

## 2021-10-28 DIAGNOSIS — F419 Anxiety disorder, unspecified: Secondary | ICD-10-CM | POA: Diagnosis not present

## 2021-10-28 DIAGNOSIS — J449 Chronic obstructive pulmonary disease, unspecified: Secondary | ICD-10-CM | POA: Diagnosis not present

## 2021-10-28 DIAGNOSIS — M069 Rheumatoid arthritis, unspecified: Secondary | ICD-10-CM | POA: Diagnosis not present

## 2021-10-28 DIAGNOSIS — K047 Periapical abscess without sinus: Secondary | ICD-10-CM | POA: Diagnosis not present

## 2021-10-28 DIAGNOSIS — Z5181 Encounter for therapeutic drug level monitoring: Secondary | ICD-10-CM | POA: Diagnosis not present

## 2021-10-28 DIAGNOSIS — Z792 Long term (current) use of antibiotics: Secondary | ICD-10-CM | POA: Diagnosis not present

## 2021-10-28 DIAGNOSIS — Z452 Encounter for adjustment and management of vascular access device: Secondary | ICD-10-CM | POA: Diagnosis not present

## 2021-10-28 DIAGNOSIS — F17210 Nicotine dependence, cigarettes, uncomplicated: Secondary | ICD-10-CM | POA: Diagnosis not present

## 2021-10-28 DIAGNOSIS — J386 Stenosis of larynx: Secondary | ICD-10-CM | POA: Diagnosis not present

## 2021-10-28 DIAGNOSIS — L4050 Arthropathic psoriasis, unspecified: Secondary | ICD-10-CM | POA: Diagnosis not present

## 2021-10-29 DIAGNOSIS — K047 Periapical abscess without sinus: Secondary | ICD-10-CM | POA: Diagnosis not present

## 2021-11-03 DIAGNOSIS — K047 Periapical abscess without sinus: Secondary | ICD-10-CM | POA: Diagnosis not present

## 2021-11-03 DIAGNOSIS — F419 Anxiety disorder, unspecified: Secondary | ICD-10-CM | POA: Diagnosis not present

## 2021-11-03 DIAGNOSIS — J386 Stenosis of larynx: Secondary | ICD-10-CM | POA: Diagnosis not present

## 2021-11-03 DIAGNOSIS — Z452 Encounter for adjustment and management of vascular access device: Secondary | ICD-10-CM | POA: Diagnosis not present

## 2021-11-03 DIAGNOSIS — Z5181 Encounter for therapeutic drug level monitoring: Secondary | ICD-10-CM | POA: Diagnosis not present

## 2021-11-03 DIAGNOSIS — J9601 Acute respiratory failure with hypoxia: Secondary | ICD-10-CM | POA: Diagnosis not present

## 2021-11-03 DIAGNOSIS — M272 Inflammatory conditions of jaws: Secondary | ICD-10-CM | POA: Diagnosis not present

## 2021-11-03 DIAGNOSIS — E876 Hypokalemia: Secondary | ICD-10-CM | POA: Diagnosis not present

## 2021-11-03 DIAGNOSIS — L4050 Arthropathic psoriasis, unspecified: Secondary | ICD-10-CM | POA: Diagnosis not present

## 2021-11-03 DIAGNOSIS — M069 Rheumatoid arthritis, unspecified: Secondary | ICD-10-CM | POA: Diagnosis not present

## 2021-11-03 DIAGNOSIS — Z792 Long term (current) use of antibiotics: Secondary | ICD-10-CM | POA: Diagnosis not present

## 2021-11-03 DIAGNOSIS — J449 Chronic obstructive pulmonary disease, unspecified: Secondary | ICD-10-CM | POA: Diagnosis not present

## 2021-11-03 DIAGNOSIS — F17210 Nicotine dependence, cigarettes, uncomplicated: Secondary | ICD-10-CM | POA: Diagnosis not present

## 2021-12-08 DIAGNOSIS — R5382 Chronic fatigue, unspecified: Secondary | ICD-10-CM | POA: Diagnosis not present

## 2021-12-08 DIAGNOSIS — L03011 Cellulitis of right finger: Secondary | ICD-10-CM | POA: Diagnosis not present

## 2021-12-08 DIAGNOSIS — M79642 Pain in left hand: Secondary | ICD-10-CM | POA: Diagnosis not present

## 2021-12-08 DIAGNOSIS — L409 Psoriasis, unspecified: Secondary | ICD-10-CM | POA: Diagnosis not present

## 2021-12-08 DIAGNOSIS — M79641 Pain in right hand: Secondary | ICD-10-CM | POA: Diagnosis not present

## 2021-12-08 DIAGNOSIS — Z6824 Body mass index (BMI) 24.0-24.9, adult: Secondary | ICD-10-CM | POA: Diagnosis not present

## 2021-12-08 DIAGNOSIS — L4059 Other psoriatic arthropathy: Secondary | ICD-10-CM | POA: Diagnosis not present

## 2021-12-08 DIAGNOSIS — M25551 Pain in right hip: Secondary | ICD-10-CM | POA: Diagnosis not present

## 2022-01-12 DIAGNOSIS — M5451 Vertebrogenic low back pain: Secondary | ICD-10-CM | POA: Diagnosis not present

## 2022-01-12 DIAGNOSIS — M25551 Pain in right hip: Secondary | ICD-10-CM | POA: Diagnosis not present

## 2022-01-28 ENCOUNTER — Other Ambulatory Visit: Payer: Self-pay | Admitting: Otolaryngology

## 2022-01-28 ENCOUNTER — Other Ambulatory Visit (HOSPITAL_COMMUNITY): Payer: Self-pay | Admitting: Otolaryngology

## 2022-01-28 DIAGNOSIS — M5451 Vertebrogenic low back pain: Secondary | ICD-10-CM

## 2022-02-19 ENCOUNTER — Ambulatory Visit (HOSPITAL_COMMUNITY)
Admission: RE | Admit: 2022-02-19 | Discharge: 2022-02-19 | Disposition: A | Discharge status: Home / Self Care | Payer: Medicare HMO | Source: Ambulatory Visit | Attending: Otolaryngology | Admitting: Otolaryngology

## 2022-02-19 DIAGNOSIS — M5136 Other intervertebral disc degeneration, lumbar region: Secondary | ICD-10-CM | POA: Diagnosis not present

## 2022-02-19 DIAGNOSIS — M5451 Vertebrogenic low back pain: Secondary | ICD-10-CM | POA: Diagnosis not present

## 2022-02-19 DIAGNOSIS — M545 Low back pain, unspecified: Secondary | ICD-10-CM | POA: Diagnosis not present

## 2022-03-02 DIAGNOSIS — M546 Pain in thoracic spine: Secondary | ICD-10-CM | POA: Diagnosis not present

## 2022-03-02 DIAGNOSIS — M5451 Vertebrogenic low back pain: Secondary | ICD-10-CM | POA: Diagnosis not present

## 2022-03-13 DIAGNOSIS — L02436 Carbuncle of left lower limb: Secondary | ICD-10-CM | POA: Diagnosis not present

## 2022-03-16 DIAGNOSIS — L02436 Carbuncle of left lower limb: Secondary | ICD-10-CM | POA: Diagnosis not present

## 2022-03-16 DIAGNOSIS — L089 Local infection of the skin and subcutaneous tissue, unspecified: Secondary | ICD-10-CM | POA: Diagnosis not present

## 2022-03-18 DIAGNOSIS — M5414 Radiculopathy, thoracic region: Secondary | ICD-10-CM | POA: Diagnosis not present

## 2022-03-29 DIAGNOSIS — M546 Pain in thoracic spine: Secondary | ICD-10-CM | POA: Diagnosis not present

## 2022-05-12 ENCOUNTER — Telehealth: Payer: Self-pay

## 2022-05-12 NOTE — Telephone Encounter (Signed)
Patient states for the last two weeks she has noticed intermittent swelling in her left jaw. Reports it will "puff up" for a day or two and then resolve.   She denies fever, chills, pain, or redness. Was told by her oral surgeon that swelling and numbness may take some time to resolve. Says she still has numbness and that the swelling resembles having some food stuffed in her cheek, but that you might not notice unless you knew her. Denies pain in the area of the drain incisions. Will route to provider.   Beryle Flock, RN

## 2022-05-18 DIAGNOSIS — M79642 Pain in left hand: Secondary | ICD-10-CM | POA: Diagnosis not present

## 2022-05-18 DIAGNOSIS — L03011 Cellulitis of right finger: Secondary | ICD-10-CM | POA: Diagnosis not present

## 2022-05-18 DIAGNOSIS — Z6823 Body mass index (BMI) 23.0-23.9, adult: Secondary | ICD-10-CM | POA: Diagnosis not present

## 2022-05-18 DIAGNOSIS — M79641 Pain in right hand: Secondary | ICD-10-CM | POA: Diagnosis not present

## 2022-05-18 DIAGNOSIS — L4059 Other psoriatic arthropathy: Secondary | ICD-10-CM | POA: Diagnosis not present

## 2022-05-18 DIAGNOSIS — L409 Psoriasis, unspecified: Secondary | ICD-10-CM | POA: Diagnosis not present

## 2022-05-18 DIAGNOSIS — R5382 Chronic fatigue, unspecified: Secondary | ICD-10-CM | POA: Diagnosis not present

## 2022-05-18 DIAGNOSIS — M25551 Pain in right hip: Secondary | ICD-10-CM | POA: Diagnosis not present

## 2022-05-31 DIAGNOSIS — F418 Other specified anxiety disorders: Secondary | ICD-10-CM | POA: Diagnosis not present

## 2022-05-31 DIAGNOSIS — Z0000 Encounter for general adult medical examination without abnormal findings: Secondary | ICD-10-CM | POA: Diagnosis not present

## 2022-05-31 DIAGNOSIS — Z1231 Encounter for screening mammogram for malignant neoplasm of breast: Secondary | ICD-10-CM | POA: Diagnosis not present

## 2022-05-31 DIAGNOSIS — F17200 Nicotine dependence, unspecified, uncomplicated: Secondary | ICD-10-CM | POA: Diagnosis not present

## 2022-05-31 DIAGNOSIS — L409 Psoriasis, unspecified: Secondary | ICD-10-CM | POA: Diagnosis not present

## 2022-05-31 DIAGNOSIS — Z13220 Encounter for screening for lipoid disorders: Secondary | ICD-10-CM | POA: Diagnosis not present

## 2022-05-31 DIAGNOSIS — J449 Chronic obstructive pulmonary disease, unspecified: Secondary | ICD-10-CM | POA: Diagnosis not present

## 2022-05-31 DIAGNOSIS — Z7689 Persons encountering health services in other specified circumstances: Secondary | ICD-10-CM | POA: Diagnosis not present

## 2022-05-31 DIAGNOSIS — L4050 Arthropathic psoriasis, unspecified: Secondary | ICD-10-CM | POA: Diagnosis not present

## 2022-05-31 DIAGNOSIS — E559 Vitamin D deficiency, unspecified: Secondary | ICD-10-CM | POA: Diagnosis not present

## 2022-06-03 ENCOUNTER — Other Ambulatory Visit (HOSPITAL_COMMUNITY): Payer: Self-pay | Admitting: Family Medicine

## 2022-06-03 DIAGNOSIS — Z1231 Encounter for screening mammogram for malignant neoplasm of breast: Secondary | ICD-10-CM

## 2022-06-04 DIAGNOSIS — Z13220 Encounter for screening for lipoid disorders: Secondary | ICD-10-CM | POA: Diagnosis not present

## 2022-06-04 DIAGNOSIS — E559 Vitamin D deficiency, unspecified: Secondary | ICD-10-CM | POA: Diagnosis not present

## 2022-06-04 DIAGNOSIS — Z0000 Encounter for general adult medical examination without abnormal findings: Secondary | ICD-10-CM | POA: Diagnosis not present

## 2022-06-04 DIAGNOSIS — Z79899 Other long term (current) drug therapy: Secondary | ICD-10-CM | POA: Diagnosis not present

## 2022-07-06 DIAGNOSIS — F4310 Post-traumatic stress disorder, unspecified: Secondary | ICD-10-CM | POA: Diagnosis not present

## 2022-07-14 DIAGNOSIS — F418 Other specified anxiety disorders: Secondary | ICD-10-CM | POA: Diagnosis not present

## 2022-07-14 DIAGNOSIS — L4050 Arthropathic psoriasis, unspecified: Secondary | ICD-10-CM | POA: Diagnosis not present

## 2022-07-14 DIAGNOSIS — E559 Vitamin D deficiency, unspecified: Secondary | ICD-10-CM | POA: Diagnosis not present

## 2022-07-14 DIAGNOSIS — L409 Psoriasis, unspecified: Secondary | ICD-10-CM | POA: Diagnosis not present

## 2022-07-14 DIAGNOSIS — H6090 Unspecified otitis externa, unspecified ear: Secondary | ICD-10-CM | POA: Diagnosis not present

## 2022-07-14 DIAGNOSIS — J449 Chronic obstructive pulmonary disease, unspecified: Secondary | ICD-10-CM | POA: Diagnosis not present

## 2022-07-14 DIAGNOSIS — G8929 Other chronic pain: Secondary | ICD-10-CM | POA: Diagnosis not present

## 2022-07-14 DIAGNOSIS — I10 Essential (primary) hypertension: Secondary | ICD-10-CM | POA: Diagnosis not present

## 2022-07-14 DIAGNOSIS — F17200 Nicotine dependence, unspecified, uncomplicated: Secondary | ICD-10-CM | POA: Diagnosis not present

## 2022-08-06 DIAGNOSIS — L03116 Cellulitis of left lower limb: Secondary | ICD-10-CM | POA: Diagnosis not present

## 2022-10-06 DIAGNOSIS — L409 Psoriasis, unspecified: Secondary | ICD-10-CM | POA: Diagnosis not present

## 2022-10-06 DIAGNOSIS — L299 Pruritus, unspecified: Secondary | ICD-10-CM | POA: Diagnosis not present

## 2022-10-06 DIAGNOSIS — G8191 Hemiplegia, unspecified affecting right dominant side: Secondary | ICD-10-CM | POA: Diagnosis not present

## 2022-10-06 DIAGNOSIS — I10 Essential (primary) hypertension: Secondary | ICD-10-CM | POA: Diagnosis not present

## 2022-10-06 DIAGNOSIS — Z299 Encounter for prophylactic measures, unspecified: Secondary | ICD-10-CM | POA: Diagnosis not present

## 2022-10-07 DIAGNOSIS — Z79899 Other long term (current) drug therapy: Secondary | ICD-10-CM | POA: Diagnosis not present

## 2022-10-07 DIAGNOSIS — G8191 Hemiplegia, unspecified affecting right dominant side: Secondary | ICD-10-CM | POA: Diagnosis not present

## 2022-10-07 DIAGNOSIS — E7800 Pure hypercholesterolemia, unspecified: Secondary | ICD-10-CM | POA: Diagnosis not present

## 2022-10-07 DIAGNOSIS — R5383 Other fatigue: Secondary | ICD-10-CM | POA: Diagnosis not present

## 2022-10-12 DIAGNOSIS — I10 Essential (primary) hypertension: Secondary | ICD-10-CM | POA: Diagnosis not present

## 2022-10-12 DIAGNOSIS — G459 Transient cerebral ischemic attack, unspecified: Secondary | ICD-10-CM | POA: Diagnosis not present

## 2022-10-12 DIAGNOSIS — G939 Disorder of brain, unspecified: Secondary | ICD-10-CM | POA: Diagnosis not present

## 2022-10-12 DIAGNOSIS — C719 Malignant neoplasm of brain, unspecified: Secondary | ICD-10-CM | POA: Diagnosis not present

## 2022-10-12 DIAGNOSIS — Z79899 Other long term (current) drug therapy: Secondary | ICD-10-CM | POA: Diagnosis not present

## 2022-10-12 DIAGNOSIS — R531 Weakness: Secondary | ICD-10-CM | POA: Diagnosis not present

## 2022-10-12 DIAGNOSIS — R519 Headache, unspecified: Secondary | ICD-10-CM | POA: Diagnosis not present

## 2022-10-12 DIAGNOSIS — Z8679 Personal history of other diseases of the circulatory system: Secondary | ICD-10-CM | POA: Diagnosis not present

## 2022-10-12 DIAGNOSIS — F17210 Nicotine dependence, cigarettes, uncomplicated: Secondary | ICD-10-CM | POA: Diagnosis not present

## 2022-10-12 DIAGNOSIS — Z885 Allergy status to narcotic agent status: Secondary | ICD-10-CM | POA: Diagnosis not present

## 2022-10-13 DIAGNOSIS — D496 Neoplasm of unspecified behavior of brain: Secondary | ICD-10-CM | POA: Diagnosis not present

## 2022-10-13 DIAGNOSIS — G935 Compression of brain: Secondary | ICD-10-CM | POA: Diagnosis not present

## 2022-10-13 DIAGNOSIS — R4701 Aphasia: Secondary | ICD-10-CM | POA: Diagnosis not present

## 2022-10-13 DIAGNOSIS — R918 Other nonspecific abnormal finding of lung field: Secondary | ICD-10-CM | POA: Diagnosis not present

## 2022-10-13 DIAGNOSIS — F419 Anxiety disorder, unspecified: Secondary | ICD-10-CM | POA: Diagnosis not present

## 2022-10-13 DIAGNOSIS — G9389 Other specified disorders of brain: Secondary | ICD-10-CM | POA: Diagnosis not present

## 2022-10-13 DIAGNOSIS — Z886 Allergy status to analgesic agent status: Secondary | ICD-10-CM | POA: Diagnosis not present

## 2022-10-13 DIAGNOSIS — Z515 Encounter for palliative care: Secondary | ICD-10-CM | POA: Diagnosis not present

## 2022-10-13 DIAGNOSIS — G936 Cerebral edema: Secondary | ICD-10-CM | POA: Diagnosis not present

## 2022-10-13 DIAGNOSIS — J449 Chronic obstructive pulmonary disease, unspecified: Secondary | ICD-10-CM | POA: Diagnosis not present

## 2022-10-13 DIAGNOSIS — R5381 Other malaise: Secondary | ICD-10-CM | POA: Diagnosis not present

## 2022-10-13 DIAGNOSIS — F17210 Nicotine dependence, cigarettes, uncomplicated: Secondary | ICD-10-CM | POA: Diagnosis not present

## 2022-10-13 DIAGNOSIS — R519 Headache, unspecified: Secondary | ICD-10-CM | POA: Diagnosis not present

## 2022-10-18 DIAGNOSIS — F418 Other specified anxiety disorders: Secondary | ICD-10-CM | POA: Diagnosis not present

## 2022-10-18 DIAGNOSIS — Z5181 Encounter for therapeutic drug level monitoring: Secondary | ICD-10-CM | POA: Diagnosis not present

## 2022-10-18 DIAGNOSIS — R569 Unspecified convulsions: Secondary | ICD-10-CM | POA: Diagnosis not present

## 2022-10-18 DIAGNOSIS — E876 Hypokalemia: Secondary | ICD-10-CM | POA: Diagnosis not present

## 2022-10-18 DIAGNOSIS — F419 Anxiety disorder, unspecified: Secondary | ICD-10-CM | POA: Diagnosis not present

## 2022-10-18 DIAGNOSIS — R488 Other symbolic dysfunctions: Secondary | ICD-10-CM | POA: Diagnosis not present

## 2022-10-18 DIAGNOSIS — L2082 Flexural eczema: Secondary | ICD-10-CM | POA: Diagnosis not present

## 2022-10-18 DIAGNOSIS — R4701 Aphasia: Secondary | ICD-10-CM | POA: Diagnosis not present

## 2022-10-18 DIAGNOSIS — C719 Malignant neoplasm of brain, unspecified: Secondary | ICD-10-CM | POA: Diagnosis not present

## 2022-10-18 DIAGNOSIS — J449 Chronic obstructive pulmonary disease, unspecified: Secondary | ICD-10-CM | POA: Diagnosis not present

## 2022-10-18 DIAGNOSIS — G9389 Other specified disorders of brain: Secondary | ICD-10-CM | POA: Diagnosis not present

## 2022-10-18 DIAGNOSIS — G8929 Other chronic pain: Secondary | ICD-10-CM | POA: Diagnosis not present

## 2022-10-18 DIAGNOSIS — R52 Pain, unspecified: Secondary | ICD-10-CM | POA: Diagnosis not present

## 2022-10-18 DIAGNOSIS — L4050 Arthropathic psoriasis, unspecified: Secondary | ICD-10-CM | POA: Diagnosis not present

## 2022-10-18 DIAGNOSIS — I10 Essential (primary) hypertension: Secondary | ICD-10-CM | POA: Diagnosis not present

## 2022-10-18 DIAGNOSIS — Z7401 Bed confinement status: Secondary | ICD-10-CM | POA: Diagnosis not present

## 2022-10-18 DIAGNOSIS — M6281 Muscle weakness (generalized): Secondary | ICD-10-CM | POA: Diagnosis not present

## 2022-10-18 DIAGNOSIS — E785 Hyperlipidemia, unspecified: Secondary | ICD-10-CM | POA: Diagnosis not present

## 2022-10-18 DIAGNOSIS — R279 Unspecified lack of coordination: Secondary | ICD-10-CM | POA: Diagnosis not present

## 2022-10-18 DIAGNOSIS — G4700 Insomnia, unspecified: Secondary | ICD-10-CM | POA: Diagnosis not present

## 2022-10-18 DIAGNOSIS — F4323 Adjustment disorder with mixed anxiety and depressed mood: Secondary | ICD-10-CM | POA: Diagnosis not present

## 2022-10-18 DIAGNOSIS — M069 Rheumatoid arthritis, unspecified: Secondary | ICD-10-CM | POA: Diagnosis not present

## 2022-10-18 DIAGNOSIS — Z515 Encounter for palliative care: Secondary | ICD-10-CM | POA: Diagnosis not present

## 2022-10-19 DIAGNOSIS — F4323 Adjustment disorder with mixed anxiety and depressed mood: Secondary | ICD-10-CM | POA: Diagnosis not present

## 2022-10-20 DIAGNOSIS — G4700 Insomnia, unspecified: Secondary | ICD-10-CM | POA: Diagnosis not present

## 2022-10-20 DIAGNOSIS — F419 Anxiety disorder, unspecified: Secondary | ICD-10-CM | POA: Diagnosis not present

## 2022-10-20 DIAGNOSIS — I10 Essential (primary) hypertension: Secondary | ICD-10-CM | POA: Diagnosis not present

## 2022-10-20 DIAGNOSIS — Z5181 Encounter for therapeutic drug level monitoring: Secondary | ICD-10-CM | POA: Diagnosis not present

## 2022-10-21 DIAGNOSIS — C719 Malignant neoplasm of brain, unspecified: Secondary | ICD-10-CM | POA: Diagnosis not present

## 2022-10-21 DIAGNOSIS — Z515 Encounter for palliative care: Secondary | ICD-10-CM | POA: Diagnosis not present

## 2022-12-09 DEATH — deceased

## 2022-12-28 IMAGING — CT CT MAXILLOFACIAL W/ CM
3 of 4 series · 13 of 47 positions shown, 15 images · IV contrast (agent unspecified)
Comparison: Neck CT 09/18/2021

CLINICAL DATA: Maxillary/facial abscess. Concern for osteomyelitis.

EXAM:
CT MAXILLOFACIAL WITH CONTRAST
TECHNIQUE: Multidetector CT imaging of the maxillofacial structures was
performed with intravenous contrast. Multiplanar CT image
reconstructions were also generated.

[Series 3: facialbone 2.0 st · axial · 0.44mm/px · z∈[-193,-43]mm · 7 of 92 slices shown, 9 images]
[im 10/92  brain]
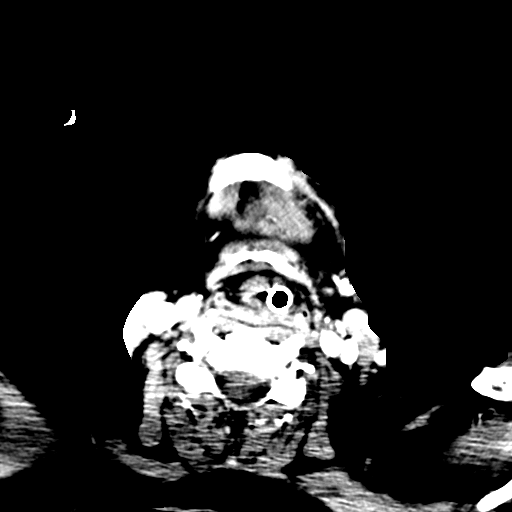
[im 10/92  bone]
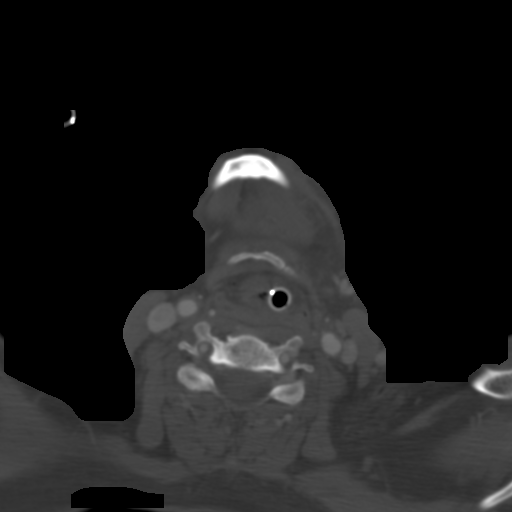
[im 22/92  bone]
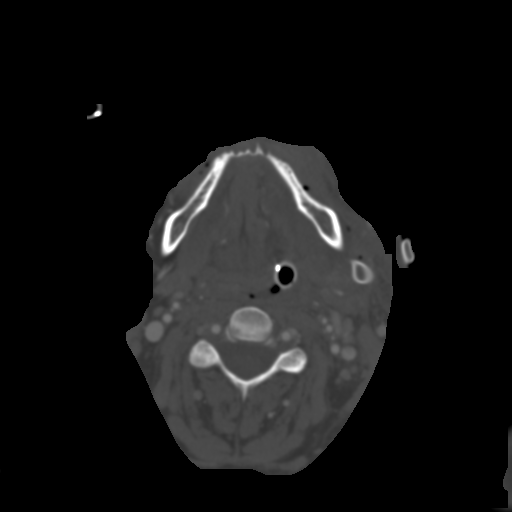
[im 35/92  bone]
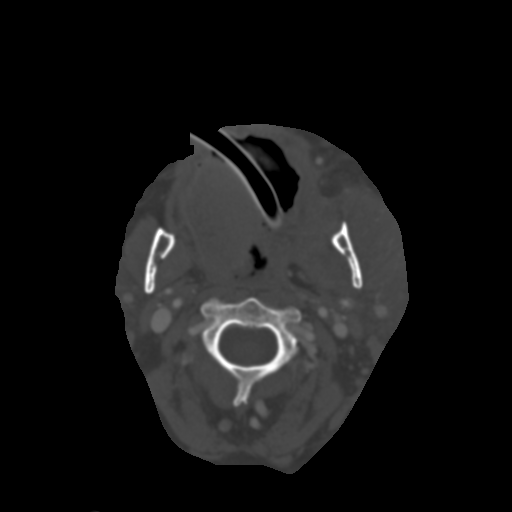
[im 48/92  bone]
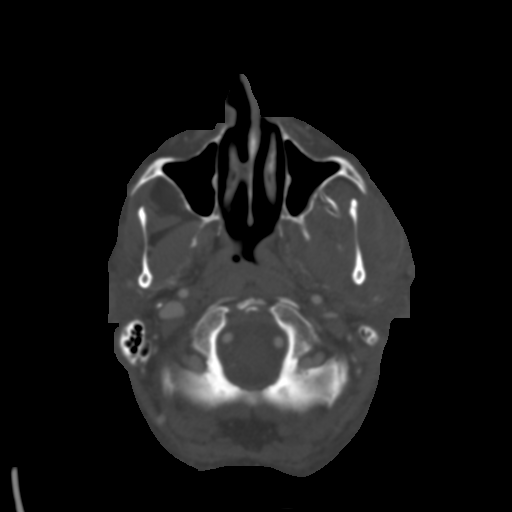
[im 60/92  brain]
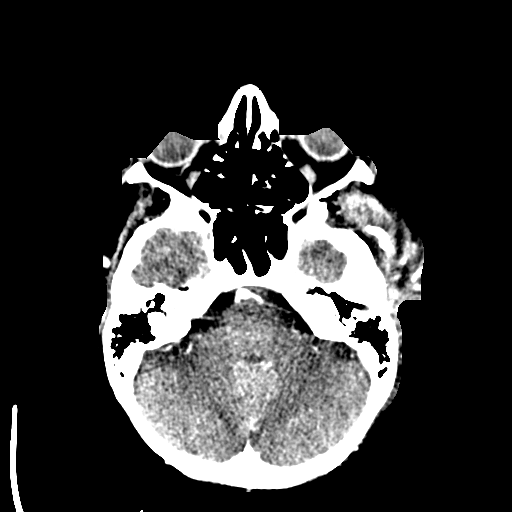
[im 60/92  bone]
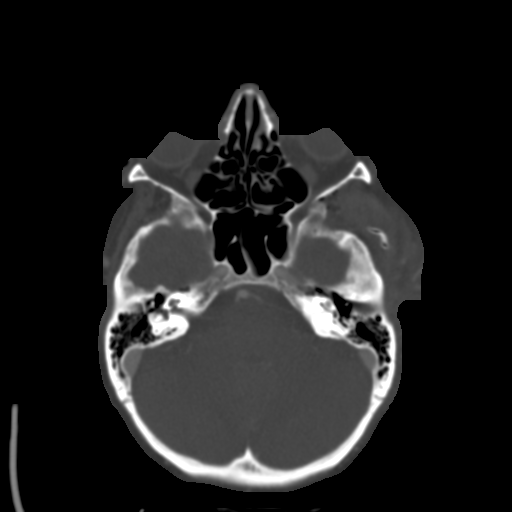
[im 73/92  bone]
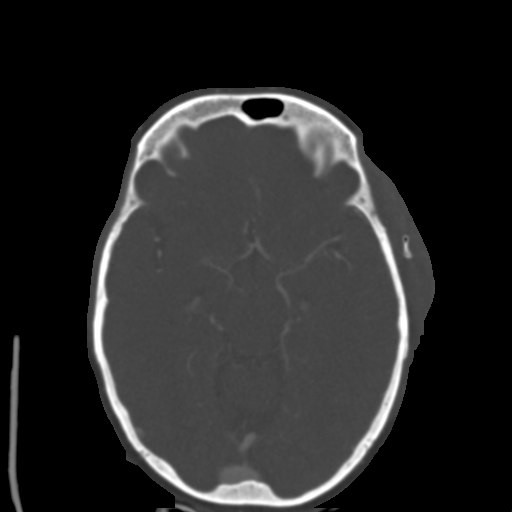
[im 85/92  bone]
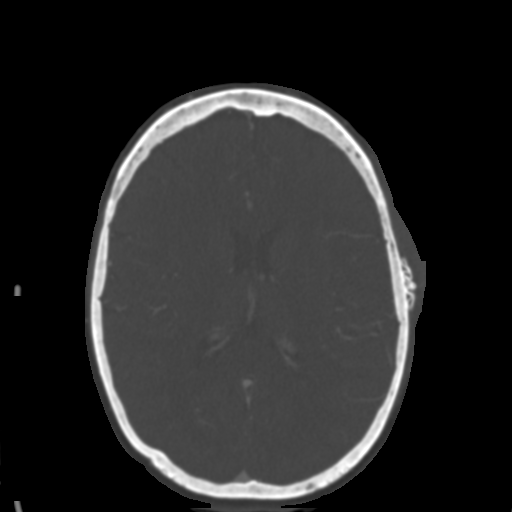

[Series 7: facialbone 2.0 sag st · sagittal · 0.38mm/px · 3 of 97 slices shown]
[im 33/97  bone]
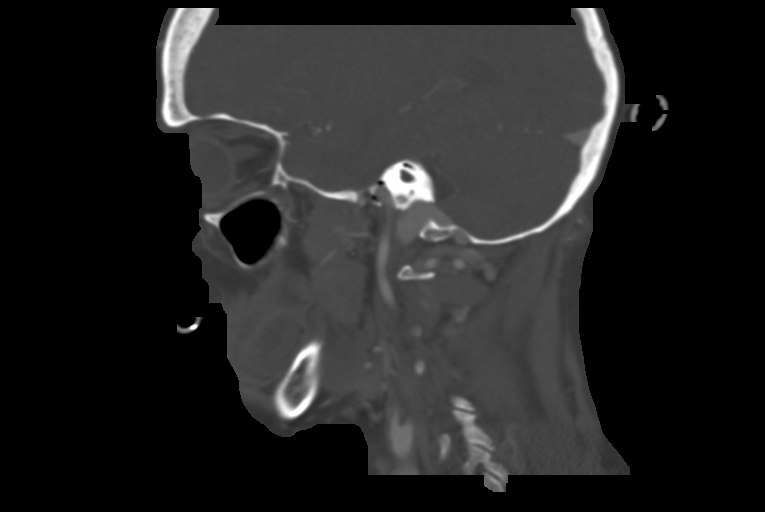
[im 49/97  bone]
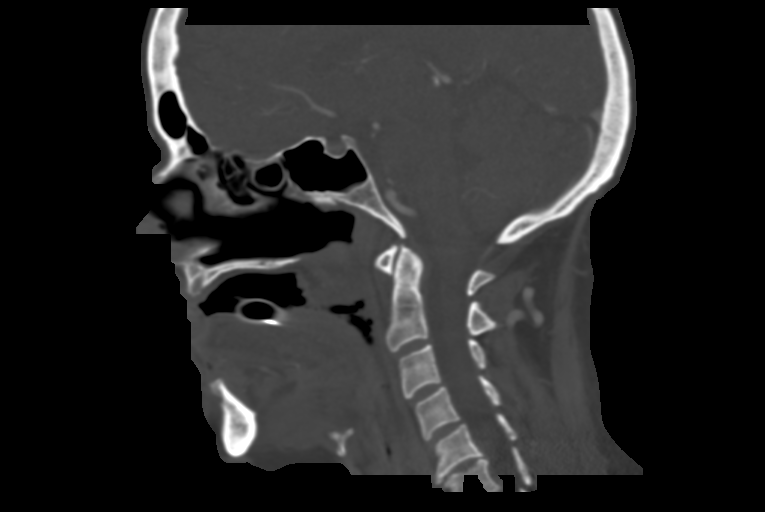
[im 65/97  bone]
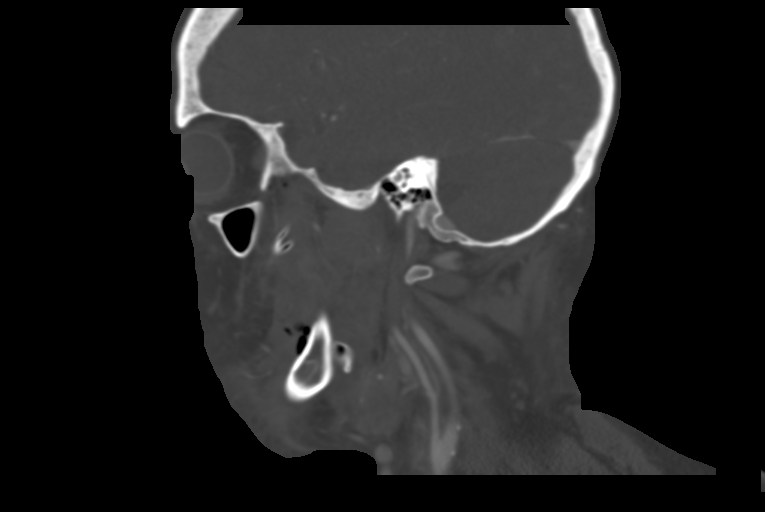

[Series 10: bone 2.0 cor · coronal · 0.37mm/px · 3 of 102 slices shown]
[im 26/102  bone]
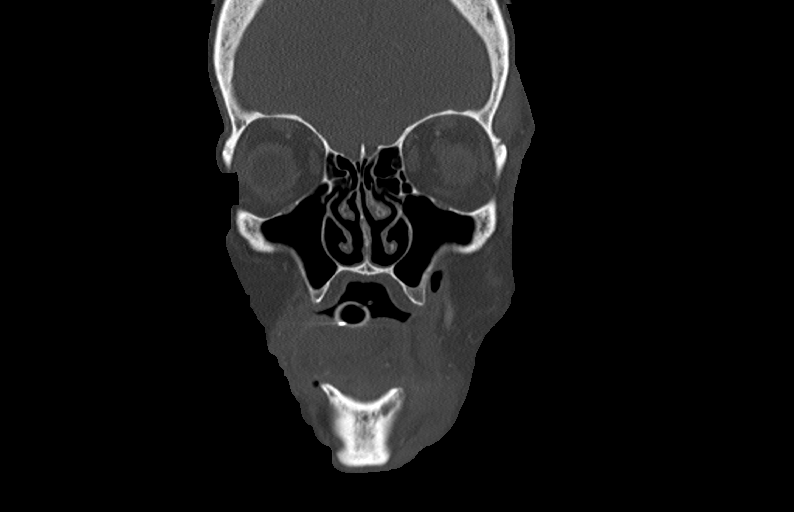
[im 51/102  bone]
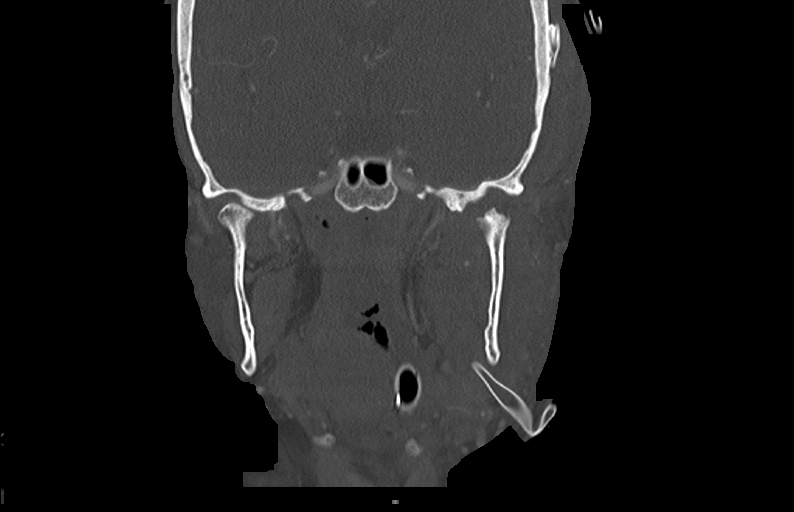
[im 76/102  bone]
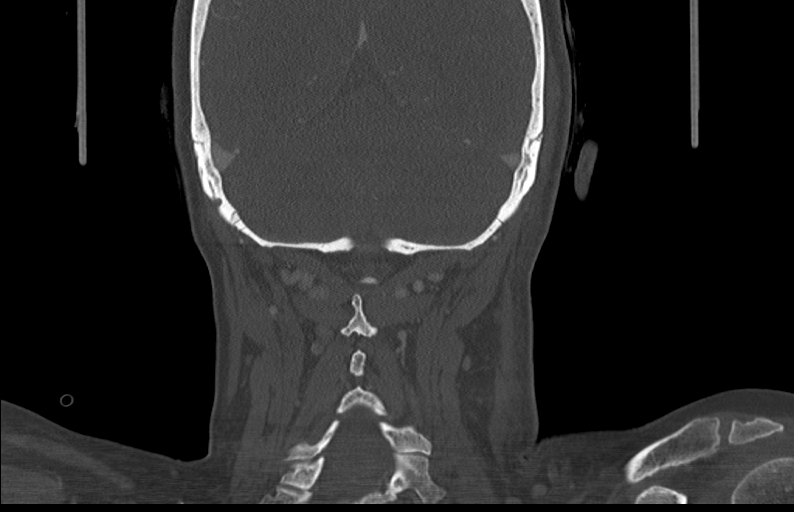

[13 of 47 positions shown; findings below may reference images not displayed]

RADIATION DOSE REDUCTION: This exam was performed according to the
departmental dose-optimization program which includes automated
exposure control, adjustment of the mA and/or kV according to
patient size and/or use of iterative reconstruction technique.

CONTRAST:  100mL OMNIPAQUE IOHEXOL 350 MG/ML SOLN
FINDINGS: Osseous: Edentulous, with interval extraction of the remaining
mandibular incisors on the prior CT. Unchanged few small gas bubbles
in the left mandibular body without bone erosion in this area.
Unchanged widening of the left temporomandibular joint and erosive
and sclerotic changes involving the left mandibular condyle.

Orbits: Mild left periorbital soft tissue swelling. No postseptal
inflammation.

Sinuses: Trace mucosal thickening in the left maxillary and left
sphenoid sinuses. Clear mastoid air cells and middle ear cavities.

Soft tissues: Interval incision and drainage of the left
submandibular and left masticator space abscesses extending into the
scalp shown on yesterday's CT with drains in place. Persistent
extensive inflammation in these regions, particularly in the left
masticator space with involvement of the masseter and pterygoid
musculature. No sizable residual undrained fluid collection. Small
volume gas in the regions of abscess drainage. Unchanged borderline
to mildly enlarged left submandibular and left level II lymph nodes,
likely reactive. Partially visualized endotracheal tube with small
volume fluid/secretions in the pharynx. Limited assessment of the
pharyngeal soft tissues.

Limited intracranial: Unremarkable.
IMPRESSION: 1. Interval drainage of abscesses involving the left masticator and
submandibular spaces and scalp. Persistent extensive inflammation in
these regions without sizable residual undrained fluid collection.
2. Interval extraction of remaining mandibular teeth. Unchanged few
small foci of gas in the left mandibular body, of uncertain etiology
and without cortical destruction in this region to clearly indicate
osteomyelitis.
3. Widening of the left temporomandibular joint with erosion and
sclerosis of the left mandibular condyle which could reflect an
inflammatory arthritis or septic arthritis.

## 2023-01-17 IMAGING — CT CT MAXILLOFACIAL W/ CM
3 series · 16 of 47 positions shown, 19 images · IV contrast (APPLIED)
Comparison: 09/19/2021

CLINICAL DATA: Worsening pain and left cheek with concern for
worsening infection

EXAM:
CT MAXILLOFACIAL WITH CONTRAST
TECHNIQUE: Multidetector CT imaging of the maxillofacial structures was
performed with intravenous contrast. Multiplanar CT image
reconstructions were also generated.

[Series 2: 1 max soft · axial · 0.34mm/px · z∈[+990,+1118]mm · 10 of 76 slices shown, 13 images]
[im 6/76  brain]
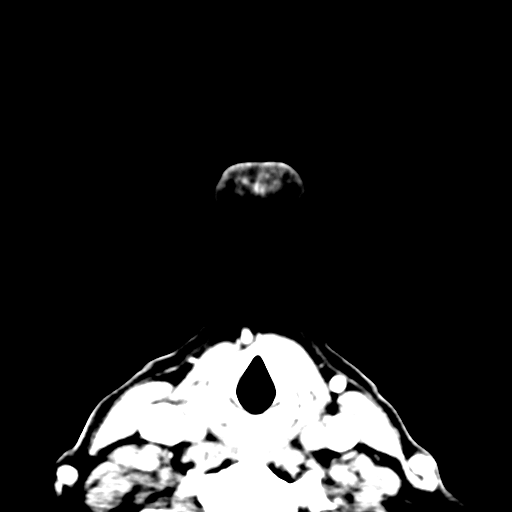
[im 6/76  bone]
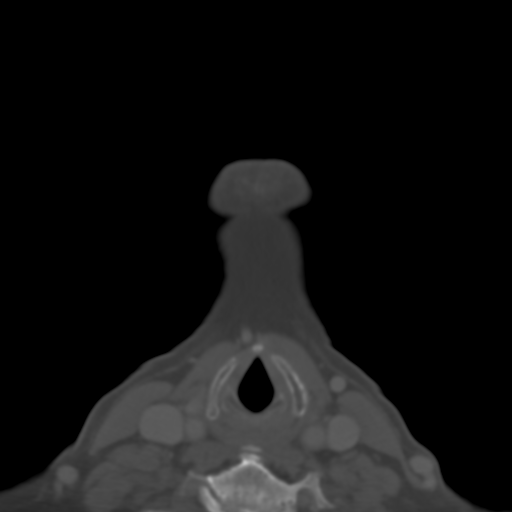
[im 13/76  bone]
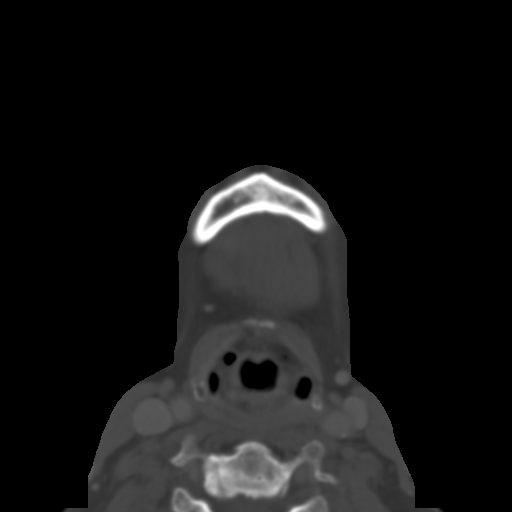
[im 21/76  bone]
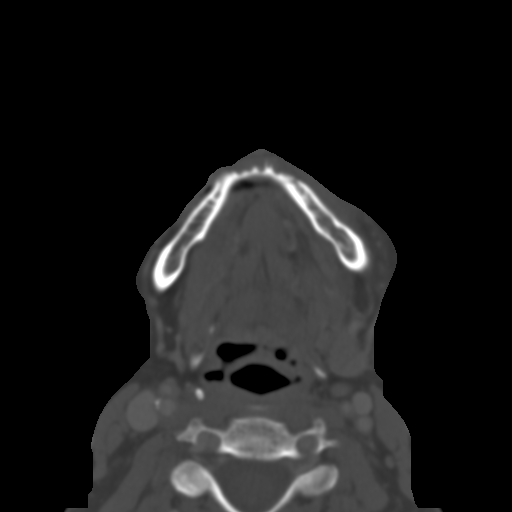
[im 26/76  bone]
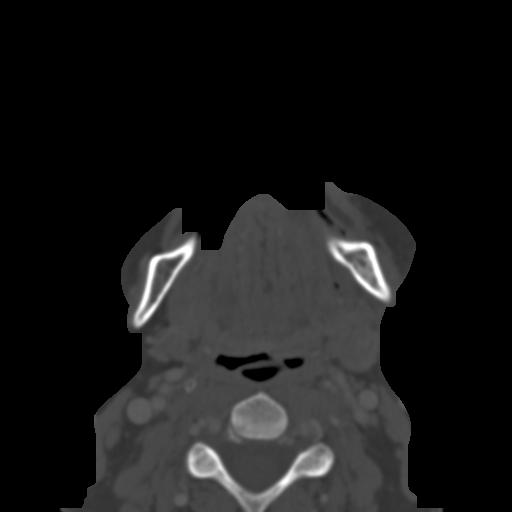
[im 34/76  brain]
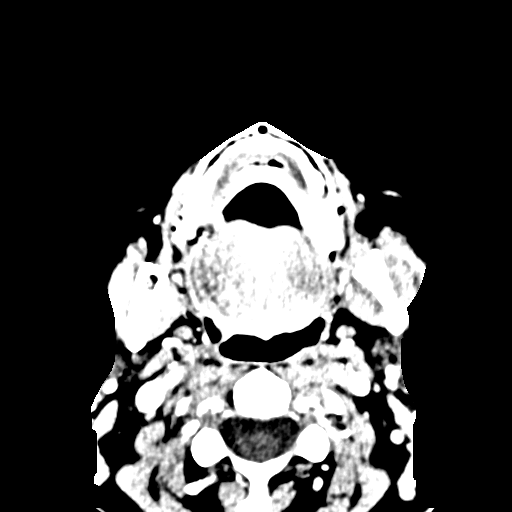
[im 34/76  bone]
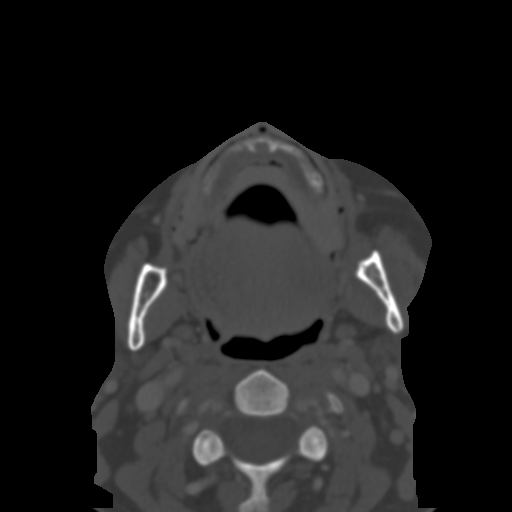
[im 42/76  bone]
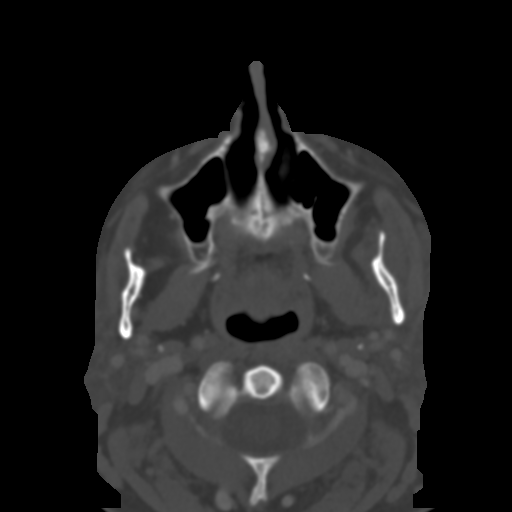
[im 50/76  bone]
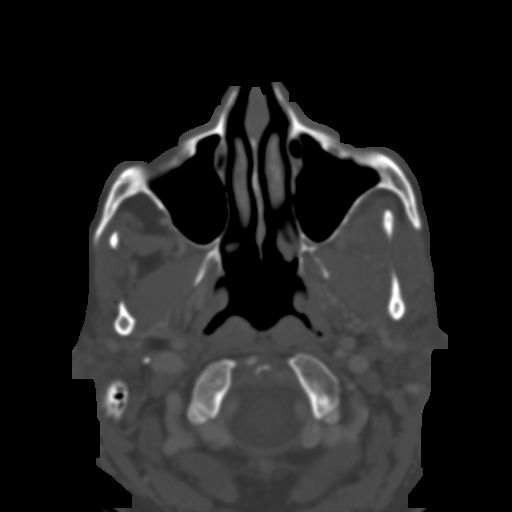
[im 57/76  bone]
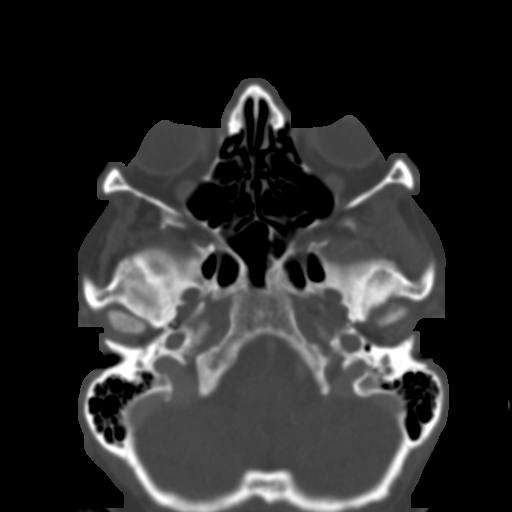
[im 63/76  brain]
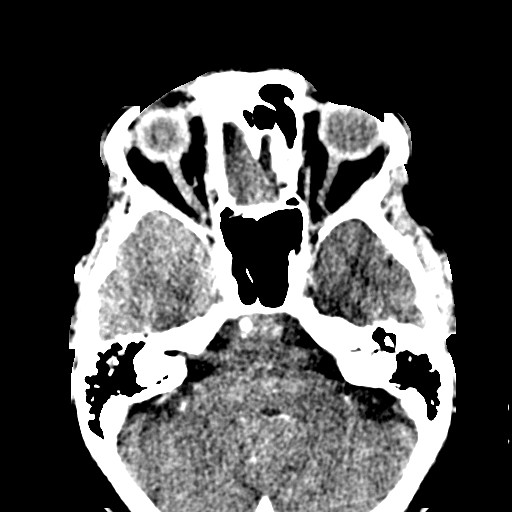
[im 63/76  bone]
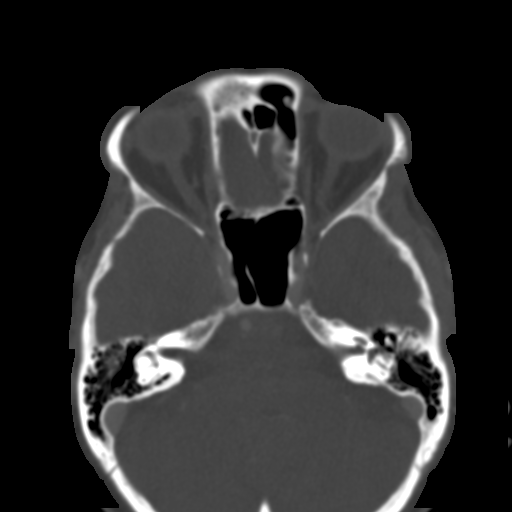
[im 70/76  bone]
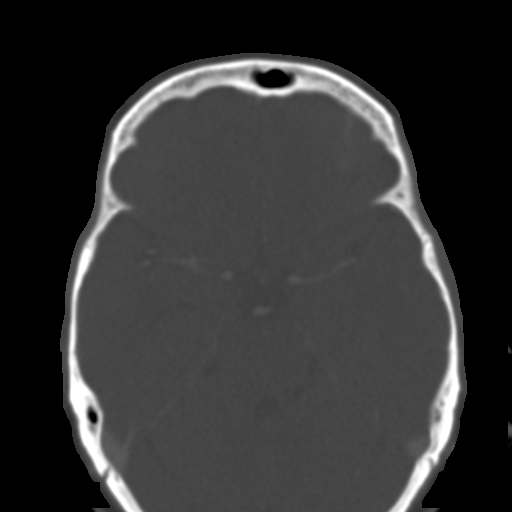

[Series 7: coronal soft · coronal · 0.37mm/px · 3 of 95 slices shown]
[im 32/95  bone]
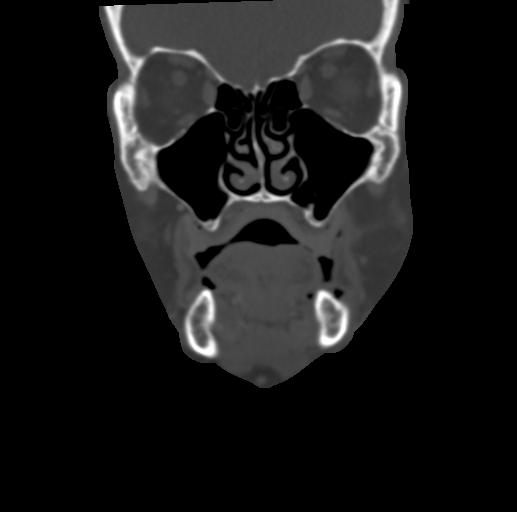
[im 42/95  bone]
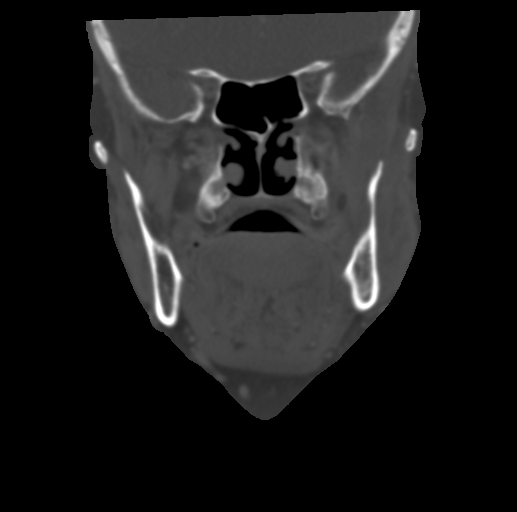
[im 53/95  bone]
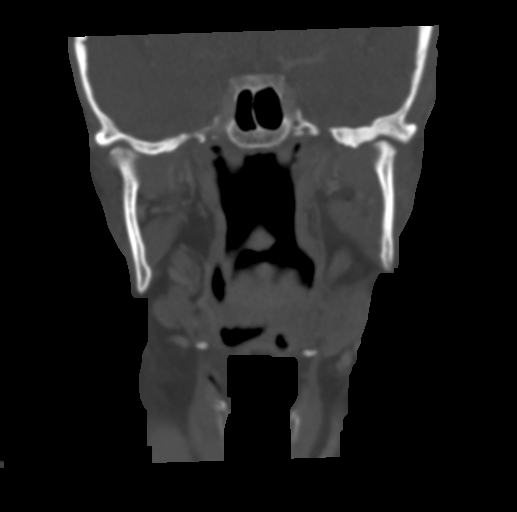

[Series 9: sagittal soft · sagittal · 0.41mm/px · 3 of 92 slices shown]
[im 31/92  bone]
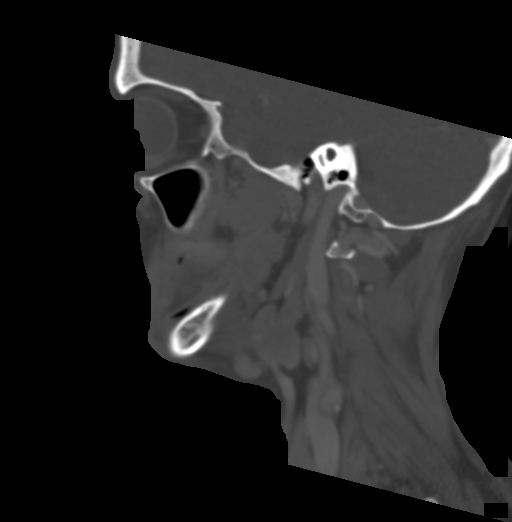
[im 46/92  bone]
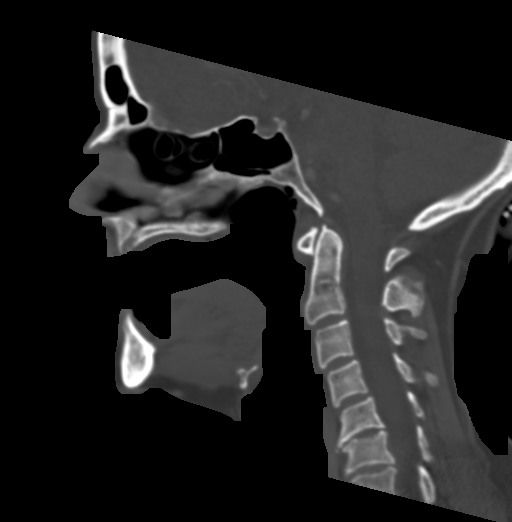
[im 61/92  bone]
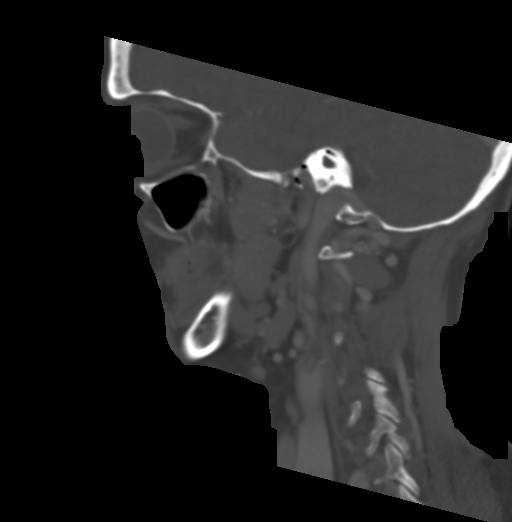

[16 of 47 positions shown; findings below may reference images not displayed]

RADIATION DOSE REDUCTION: This exam was performed according to the
departmental dose-optimization program which includes automated
exposure control, adjustment of the mA and/or kV according to
patient size and/or use of iterative reconstruction technique.

CONTRAST:  75mL OMNIPAQUE IOHEXOL 300 MG/ML  SOLN
FINDINGS: Osseous: No new erosive changes. Irregularity of the left mandibular
condylar head has not progressed.

Orbits: Unremarkable.

Sinuses: Minor mucosal thickening.

Soft tissues: Left facial inflammatory changes has substantially
improved and are nearly resolved. There is no abscess.

Limited intracranial: No abnormal enhancement.
IMPRESSION: Substantially improved and nearly resolved inflammatory changes. No
abscess. No new or progressive osseous erosive changes
